# Patient Record
Sex: Female | Born: 1960 | Race: White | Hispanic: No | Marital: Single | Smoking: Never smoker
Health system: Southern US, Community
[De-identification: ages and names within clinical notes are randomized; demographics above are authoritative.]

## PROBLEM LIST (undated history)

## (undated) DIAGNOSIS — J45909 Unspecified asthma, uncomplicated: Secondary | ICD-10-CM

## (undated) DIAGNOSIS — I10 Essential (primary) hypertension: Secondary | ICD-10-CM

## (undated) HISTORY — DX: Essential (primary) hypertension: I10

## (undated) HISTORY — DX: Unspecified asthma, uncomplicated: J45.909

---

## 2012-09-29 LAB — HM COLONOSCOPY

## 2019-09-21 ENCOUNTER — Encounter: Payer: Self-pay | Admitting: Family Medicine

## 2019-09-21 ENCOUNTER — Other Ambulatory Visit: Payer: Self-pay

## 2019-09-21 ENCOUNTER — Ambulatory Visit (INDEPENDENT_AMBULATORY_CARE_PROVIDER_SITE_OTHER): Payer: 59

## 2019-09-21 ENCOUNTER — Ambulatory Visit: Payer: Self-pay | Admitting: Nurse Practitioner

## 2019-09-21 ENCOUNTER — Ambulatory Visit: Payer: 59 | Admitting: Family Medicine

## 2019-09-21 ENCOUNTER — Ambulatory Visit: Payer: Self-pay | Admitting: *Deleted

## 2019-09-21 VITALS — BP 140/80 | HR 85 | Temp 97.2°F | Ht 67.0 in | Wt 277.8 lb

## 2019-09-21 DIAGNOSIS — J4520 Mild intermittent asthma, uncomplicated: Secondary | ICD-10-CM | POA: Diagnosis not present

## 2019-09-21 DIAGNOSIS — R200 Anesthesia of skin: Secondary | ICD-10-CM | POA: Diagnosis not present

## 2019-09-21 DIAGNOSIS — Z7689 Persons encountering health services in other specified circumstances: Secondary | ICD-10-CM

## 2019-09-21 DIAGNOSIS — Z0000 Encounter for general adult medical examination without abnormal findings: Secondary | ICD-10-CM | POA: Diagnosis not present

## 2019-09-21 DIAGNOSIS — Z6841 Body Mass Index (BMI) 40.0 and over, adult: Secondary | ICD-10-CM

## 2019-09-21 MED ORDER — ALBUTEROL SULFATE HFA 108 (90 BASE) MCG/ACT IN AERS: 2.0000 | INHALATION_SPRAY | Freq: Four times a day (QID) | RESPIRATORY_TRACT | 2 refills | Status: DC | PRN

## 2019-09-21 MED ORDER — PREDNISONE 20 MG PO TABS: ORAL_TABLET | ORAL | 0 refills | Status: DC

## 2019-09-21 NOTE — Progress Notes (Signed)
Vicki Frazier is a 58 y.o. female  Chief Complaint  Patient presents with  . Establish Care    pap & mamm 80yrs ago, colonoscopy about 8 yrs ago. cpe X fasting    HPI: Vicki Frazier is a 58 y.o. female here to establish care with our office. She moved from [place] last fall.  She is due for CPE, labs, PAP, mammo (last 3 years ago). Last colonoscopy - 8 years ago  She takes zyrtec daily and uses flonase daily for allergies. She has done allergy shots x 2. She has well-controlled, mild intermittent asthma. She has an expired albuterol inhaler at home but would like refill of this. She uses it very infrequently.   She complains of Rt lower leg pain which she describes as "heaviness" x 4-5 days and then numbness in great toe x 3 days and now numbness in 2nd-3rd toes x 2 days, decreased sensation Rt heel since this AM. No swelling. No warmth. No tenderness to touch. No fever, chills. No falls. No back pain. Normal movement. No slurred speech. No headache. No dizziness.  She states she did PT for bulging disc in Rt lumbar spine 25 years ago and had similar but not exact same symptoms at that time. No injury or trauma.   Past Medical History:  Diagnosis Date  . Asthma   . Hypertension     History reviewed. No pertinent surgical history.  Social History   Socioeconomic History  . Marital status: Single    Spouse name: Not on file  . Number of children: Not on file  . Years of education: Not on file  . Highest education level: Not on file  Occupational History  . Not on file  Social Needs  . Financial resource strain: Not on file  . Food insecurity    Worry: Not on file    Inability: Not on file  . Transportation needs    Medical: Not on file    Non-medical: Not on file  Tobacco Use  . Smoking status: Never Smoker  . Smokeless tobacco: Never Used  Substance and Sexual Activity  . Alcohol use: Never    Frequency: Never  . Drug use: Never  . Sexual activity: Not on file   Lifestyle  . Physical activity    Days per week: Not on file    Minutes per session: Not on file  . Stress: Not on file  Relationships  . Social Herbalist on phone: Not on file    Gets together: Not on file    Attends religious service: Not on file    Active member of club or organization: Not on file    Attends meetings of clubs or organizations: Not on file    Relationship status: Not on file  . Intimate partner violence    Fear of current or ex partner: Not on file    Emotionally abused: Not on file    Physically abused: Not on file    Forced sexual activity: Not on file  Other Topics Concern  . Not on file  Social History Narrative  . Not on file    Family History  Problem Relation Age of Onset  . Cancer Father   . Obesity Sister   . Heart disease Sister      Immunization History  Administered Date(s) Administered  . Influenza,inj,Quad PF,6+ Mos 07/12/2019    No outpatient encounter medications on file as of 09/21/2019.   No facility-administered encounter  medications on file as of 09/21/2019.      ROS: Pertinent positives and negatives noted in HPI. Remainder of ROS non-contributory  No Known Allergies  BP 140/80   Pulse 85   Temp (!) 97.2 F (36.2 C) (Temporal)   Ht 5\' 7"  (1.702 m)   Wt 277 lb 12.8 oz (126 kg)   SpO2 96%   BMI 43.51 kg/m   Physical Exam  Constitutional: She is oriented to person, place, and time. She appears well-developed and well-nourished. No distress.  Obese   Musculoskeletal: Normal range of motion.        General: No tenderness or edema.  Neurological: She is alert and oriented to person, place, and time. She has normal strength and normal reflexes. A sensory deficit (decreased sensation plantar aspect of Rt great toe and 2-3rd toe, RLE posterior to medial malleoulus, and Rt heel) is present. No cranial nerve deficit. She exhibits normal muscle tone. Coordination normal.  Skin: Skin is warm and dry. No rash  noted. No erythema.  Psychiatric: She has a normal mood and affect. Her behavior is normal.     A/P:  1. Encounter to establish care with new doctor - due for CPE, PAP, mammo - will RTO for this  2. Mild intermittent asthma in adult without complication - stable, very well-controlled Refill: - albuterol (VENTOLIN HFA) 108 (90 Base) MCG/ACT inhaler; Inhale 2 puffs into the lungs every 6 (six) hours as needed for wheezing or shortness of breath.  Dispense: 8 g; Refill: 2  3. Annual physical exam - pt will schedule CPE appt - ALT; Future - AST; Future - Basic metabolic panel; Future - CBC; Future - Lipid panel; Future - TSH; Future - VITAMIN D 25 Hydroxy (Vit-D Deficiency, Fractures); Future  4. Class 3 severe obesity without serious comorbidity with body mass index (BMI) of 40.0 to 44.9 in adult, unspecified obesity type ([place]) - labs today and will discuss possible referral to [facility] at CPE appt - ALT; Future - AST; Future - Hemoglobin A1c; Future - Lipid panel; Future  5. 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, no back pain currently Rx: - predniSONE (DELTASONE) 20 MG tablet; 3 tabs po x 3 days, 2 tabs po x 3 days, 1 tab po x 3 days, 1/2 tab po x 3 days  Dispense: 20 tablet; Refill: 0 - DG Lumbar Spine Complete - Basic metabolic panel; Future - CBC; Future - C-reactive protein; Future - Hemoglobin A1c; Future - Iron, TIBC and Ferritin Panel; Future - Sedimentation rate; Future - TSH; Future - Vitamin B12; Future - will contact pt once lab results available to discuss results, see if pt has improvement with prednisone, and discuss further eval Discussed plan and reviewed medications with patient, including risks, benefits, and potential side effects. Pt expressed understand. All questions answered.  I  personally spent 35 min with the patient today and greater than 50% was spent in counseling, coordination of care, education

## 2019-09-21 NOTE — Telephone Encounter (Signed)
Appt with Dr. Loletha Grayer today new pt/address this concern  Ronny and Tonya--FYI

## 2019-09-21 NOTE — Telephone Encounter (Signed)
Pt called with complaint of waking up Saturday and feeling a sensation in her right leg. She stated that she now has numbness in her right big toe and has progressed to the one next one to it and now she it has gone to the bottom of her foot. She has twitching of the inner part of her knee now also.   She feels like it is progressing. She denies the pain, just the numbness. She is steady on her feet, and denies any cardiac or any other neurological symptoms. She has a new pt appt on Monday. Per protocol, she should be seen within hours. [facility] at [facility] notified regarding an appointment. Call conference in with the pratice and pt advised she would be on a brief hold. She voiced understanding.  Reason for Disposition . [1] Numbness (i.e., loss of sensation) of the face, arm / hand, or leg / foot on one side of the body AND [2] gradual onset (e.g., days to weeks) AND [3] present now  Answer Assessment - Initial Assessment Questions 1. SYMPTOM: "What is the main symptom you are concerned about?" (e.g., weakness, numbness)     numbness 2. ONSET: "When did this start?" (minutes, hours, days; while sleeping)     Saturday 3. LAST NORMAL: "When was the last time you were normal (no symptoms)?"      Before Saturday 4. PATTERN "Does this come and go, or has it been constant since it started?"  "Is it present now?"     constant 5. CARDIAC SYMPTOMS: "Have you had any of the following symptoms: chest pain, difficulty breathing, palpitations?"     no 6. NEUROLOGIC SYMPTOMS: "Have you had any of the following symptoms: headache, dizziness, vision loss, double vision, changes in speech, unsteady on your feet?"     no 7. OTHER SYMPTOMS: "Do you have any other symptoms?"     no 8. PREGNANCY: "Is there any chance you are pregnant?" "When was your last menstrual period?"     n/a  Protocols used: NEUROLOGIC DEFICIT-A-AH

## 2019-09-22 ENCOUNTER — Ambulatory Visit: Payer: Self-pay | Admitting: Nurse Practitioner

## 2019-09-22 ENCOUNTER — Other Ambulatory Visit (INDEPENDENT_AMBULATORY_CARE_PROVIDER_SITE_OTHER): Payer: 59

## 2019-09-22 DIAGNOSIS — Z6841 Body Mass Index (BMI) 40.0 and over, adult: Secondary | ICD-10-CM

## 2019-09-22 DIAGNOSIS — R200 Anesthesia of skin: Secondary | ICD-10-CM | POA: Diagnosis not present

## 2019-09-22 DIAGNOSIS — Z0000 Encounter for general adult medical examination without abnormal findings: Secondary | ICD-10-CM

## 2019-09-22 LAB — LIPID PANEL
Cholesterol: 253 mg/dL — ABNORMAL HIGH (ref 0–200)
HDL: 84.5 mg/dL (ref >39.00)
LDL Cholesterol: 154 mg/dL — ABNORMAL HIGH (ref 0–99)
NonHDL: 168.75
Total CHOL/HDL Ratio: 3
Triglycerides: 72 mg/dL (ref 0.0–149.0)
VLDL: 14.4 mg/dL (ref 0.0–40.0)

## 2019-09-22 LAB — CBC
HCT: 42.4 % (ref 36.0–46.0)
Hemoglobin: 13.8 g/dL (ref 12.0–15.0)
MCHC: 32.5 g/dL (ref 30.0–36.0)
MCV: 91.8 fl (ref 78.0–100.0)
Platelets: 350 10*3/uL (ref 150.0–400.0)
RBC: 4.62 Mil/uL (ref 3.87–5.11)
RDW: 13.8 % (ref 11.5–15.5)
WBC: 14.6 10*3/uL — ABNORMAL HIGH (ref 4.0–10.5)

## 2019-09-22 LAB — HEMOGLOBIN A1C: Hgb A1c MFr Bld: 5.7 % (ref 4.6–6.5)

## 2019-09-22 LAB — BASIC METABOLIC PANEL
BUN: 10 mg/dL (ref 6–23)
CO2: 25 mEq/L (ref 19–32)
Calcium: 10.5 mg/dL (ref 8.4–10.5)
Chloride: 103 mEq/L (ref 96–112)
Creatinine, Ser: 0.92 mg/dL (ref 0.40–1.20)
GFR: 62.71 mL/min (ref >60.00)
Glucose, Bld: 132 mg/dL — ABNORMAL HIGH (ref 70–99)
Potassium: 4.1 mEq/L (ref 3.5–5.1)
Sodium: 139 mEq/L (ref 135–145)

## 2019-09-22 LAB — VITAMIN D 25 HYDROXY (VIT D DEFICIENCY, FRACTURES): VITD: 29.61 ng/mL — ABNORMAL LOW (ref 30.00–100.00)

## 2019-09-22 LAB — ALT: ALT: 16 U/L (ref 0–35)

## 2019-09-22 LAB — C-REACTIVE PROTEIN: CRP: <1 mg/dL (ref 0.5–20.0)

## 2019-09-22 LAB — VITAMIN B12: Vitamin B-12: 650 pg/mL (ref 211–911)

## 2019-09-22 LAB — TSH: TSH: 0.54 u[IU]/mL (ref 0.35–4.50)

## 2019-09-22 LAB — SEDIMENTATION RATE: Sed Rate: 62 mm/hr — ABNORMAL HIGH (ref 0–30)

## 2019-09-22 LAB — AST: AST: 23 U/L (ref 0–37)

## 2019-09-22 NOTE — Addendum Note (Signed)
Addended by: Lynnea Ferrier on: 09/22/2019 12:51 PM   Modules accepted: Orders

## 2019-09-23 LAB — IRON,TIBC AND FERRITIN PANEL
Ferritin: 83 ng/mL (ref 15–150)
Iron Saturation: 15 % (ref 15–55)
Iron: 53 ug/dL (ref 27–159)
Total Iron Binding Capacity: 355 ug/dL (ref 250–450)
UIBC: 302 ug/dL (ref 131–425)

## 2019-09-25 ENCOUNTER — Ambulatory Visit: Payer: Self-pay | Admitting: Nurse Practitioner

## 2019-09-27 ENCOUNTER — Other Ambulatory Visit: Payer: Self-pay | Admitting: Family Medicine

## 2019-09-27 DIAGNOSIS — M5136 Other intervertebral disc degeneration, lumbar region: Secondary | ICD-10-CM

## 2019-10-26 ENCOUNTER — Encounter: Payer: 59 | Admitting: Family Medicine

## 2019-11-07 ENCOUNTER — Encounter: Payer: 59 | Admitting: Family Medicine

## 2019-11-29 ENCOUNTER — Other Ambulatory Visit: Payer: Self-pay

## 2019-11-30 ENCOUNTER — Encounter: Payer: Self-pay | Admitting: Family Medicine

## 2019-11-30 ENCOUNTER — Other Ambulatory Visit (HOSPITAL_COMMUNITY)
Admission: RE | Admit: 2019-11-30 | Discharge: 2019-11-30 | Disposition: A | Discharge status: Home / Self Care | Payer: 59 | Source: Ambulatory Visit | Attending: Family Medicine | Admitting: Family Medicine

## 2019-11-30 ENCOUNTER — Ambulatory Visit (INDEPENDENT_AMBULATORY_CARE_PROVIDER_SITE_OTHER): Payer: 59 | Admitting: Family Medicine

## 2019-11-30 VITALS — BP 118/85 | HR 85 | Temp 96.1°F | Ht 67.0 in | Wt 273.0 lb

## 2019-11-30 DIAGNOSIS — Z0000 Encounter for general adult medical examination without abnormal findings: Secondary | ICD-10-CM

## 2019-11-30 DIAGNOSIS — J4520 Mild intermittent asthma, uncomplicated: Secondary | ICD-10-CM

## 2019-11-30 DIAGNOSIS — Z124 Encounter for screening for malignant neoplasm of cervix: Secondary | ICD-10-CM | POA: Insufficient documentation

## 2019-11-30 DIAGNOSIS — E669 Obesity, unspecified: Secondary | ICD-10-CM | POA: Insufficient documentation

## 2019-11-30 DIAGNOSIS — J45909 Unspecified asthma, uncomplicated: Secondary | ICD-10-CM | POA: Insufficient documentation

## 2019-11-30 DIAGNOSIS — Z1231 Encounter for screening mammogram for malignant neoplasm of breast: Secondary | ICD-10-CM

## 2019-11-30 DIAGNOSIS — Z6841 Body Mass Index (BMI) 40.0 and over, adult: Secondary | ICD-10-CM

## 2019-11-30 DIAGNOSIS — Z1283 Encounter for screening for malignant neoplasm of skin: Secondary | ICD-10-CM

## 2019-11-30 NOTE — Progress Notes (Signed)
Vicki Frazier is a 59 y.o. female  Chief Complaint  Patient presents with  . Annual Exam    Pt has no complaints today.  Pt fasting for labs.  Pt is due for a pap smear.    HPI: Vicki Frazier is a 59 y.o. female here for annual CPE, PAP. She had done fasting labs in 09/2019.  She has no concerns or complaints today. She is due for mammogram.  Last PAP: due Last mammo: due  Last colonoscopy: due in 1-2 years  Diet/Exercise: walking 3 miles every other day, doing Whole 30 and has lost weight since 11/10/2019  Due for dental and vision  Med refills needed today? none   Past Medical History:  Diagnosis Date  . Asthma   . Hypertension     History reviewed. No pertinent surgical history.  Social History   Socioeconomic History  . Marital status: Single    Spouse name: Not on file  . Number of children: Not on file  . Years of education: Not on file  . Highest education level: Not on file  Occupational History  . Not on file  Tobacco Use  . Smoking status: Never Smoker  . Smokeless tobacco: Never Used  Substance and Sexual Activity  . Alcohol use: Never  . Drug use: Never  . Sexual activity: Not on file  Other Topics Concern  . Not on file  Social History Narrative  . Not on file   Social Determinants of Health   Financial Resource Strain:   . Difficulty of Paying Living Expenses: Not on file  Food Insecurity:   . Worried About Charity fundraiser in the Last Year: Not on file  . Ran Out of Food in the Last Year: Not on file  Transportation Needs:   . Lack of Transportation (Medical): Not on file  . Lack of Transportation (Non-Medical): Not on file  Physical Activity:   . Days of Exercise per Week: Not on file  . Minutes of Exercise per Session: Not on file  Stress:   . Feeling of Stress : Not on file  Social Connections:   . Frequency of Communication with Friends and Family: Not on file  . Frequency of Social Gatherings with Friends and Family: Not  on file  . Attends Religious Services: Not on file  . Active Member of Clubs or Organizations: Not on file  . Attends Archivist Meetings: Not on file  . Marital Status: Not on file  Intimate Partner Violence:   . Fear of Current or Ex-Partner: Not on file  . Emotionally Abused: Not on file  . Physically Abused: Not on file  . Sexually Abused: Not on file    Family History  Problem Relation Age of Onset  . Cancer Father   . Obesity Sister   . Heart disease Sister      Immunization History  Administered Date(s) Administered  . Influenza,inj,Quad PF,6+ Mos 07/12/2019    Outpatient Encounter Medications as of 11/30/2019  Medication Sig  . albuterol (VENTOLIN HFA) 108 (90 Base) MCG/ACT inhaler Inhale 2 puffs into the lungs every 6 (six) hours as needed for wheezing or shortness of breath.  . cetirizine (ZYRTEC) 10 MG chewable tablet Chew 10 mg by mouth daily.  . fluticasone (FLONASE) 50 MCG/ACT nasal spray Place into both nostrils daily.  . [DISCONTINUED] predniSONE (DELTASONE) 20 MG tablet 3 tabs po x 3 days, 2 tabs po x 3 days, 1 tab po x 3  days, 1/2 tab po x 3 days   No facility-administered encounter medications on file as of 11/30/2019.     ROS: Gen: no fever, chills  Skin: no rash, itching ENT: no ear pain, ear drainage, nasal congestion, rhinorrhea, sinus pressure, sore throat Eyes: no blurry vision, double vision Resp: no cough, wheeze,SOB Breast: no breast tenderness, no nipple discharge, no breast masses CV: no CP, palpitations, LE edema,  GI: no heartburn, n/v/d/c, abd pain GU: no dysuria, urgency, frequency, hematuria; no vaginal itching, odor, discharge MSK: no joint pain, myalgias, back pain Neuro: no dizziness, headache, weakness, vertigo Psych: no depression, anxiety, insomnia   No Known Allergies  BP 118/85 (BP Location: Left Arm, Patient Position: Sitting, Cuff Size: Large)   Pulse 85   Temp (!) 96.1 F (35.6 C) (Temporal)   Ht 5\' 7"   (1.702 m)   Wt 273 lb (123.8 kg)   SpO2 97%   BMI 42.76 kg/m   Physical Exam  Constitutional: She is oriented to person, place, and time. She appears well-developed and well-nourished. No distress.  Morbidly obese  HENT:  Head: Normocephalic and atraumatic.  Right Ear: Tympanic membrane and ear canal normal.  Left Ear: Tympanic membrane and ear canal normal.  Nose: Nose normal.  Mouth/Throat: Oropharynx is clear and moist and mucous membranes are normal.  Eyes: Pupils are equal, round, and reactive to light. Conjunctivae are normal.  Neck: No thyromegaly present.  Cardiovascular: Normal rate, regular rhythm, normal heart sounds and intact distal pulses.  No murmur heard. Pulmonary/Chest: Effort normal and breath sounds normal. No respiratory distress. She has no wheezes. She has no rhonchi. Right breast exhibits no mass, no nipple discharge, no skin change and no tenderness. Left breast exhibits no mass, no nipple discharge, no skin change and no tenderness.  Abdominal: Soft. Bowel sounds are normal. She exhibits no distension and no mass. There is no abdominal tenderness.  Genitourinary:    Vagina and uterus normal.  There is no rash, tenderness or lesion on the right labia. There is no rash, tenderness or lesion on the left labia. Cervix exhibits no motion tenderness, no discharge and no friability. Right adnexum displays no mass, no tenderness and no fullness. Left adnexum displays no mass, no tenderness and no fullness.  Musculoskeletal:        General: No edema.     Cervical back: Neck supple.  Lymphadenopathy:    She has no cervical adenopathy.  Neurological: She is alert and oriented to person, place, and time. She exhibits normal muscle tone. Coordination normal.  Skin: Skin is warm and dry.  Psychiatric: She has a normal mood and affect. Her behavior is normal.    A/P:  1. Annual physical exam - discussed importance of regular CV exercise, healthy diet, adequate sleep. She  is walking regularly and following Whole 30 diet - she is due for dental exam and vision - immunizations UTD - ALT - AST - Basic metabolic panel - VITAMIN D 25 Hydroxy (Vit-D Deficiency, Fractures) - Lipid panel - next CPE in 1 year  2. Mild intermittent asthma in adult without complication - stable, well-controlled - rarely using PRN albuterol  3. Class 3 severe obesity without serious comorbidity with body mass index (BMI) of 40.0 to 44.9 in adult, unspecified obesity type ([place]) - walking 3 miles every other day - diet over holidays not great, working to improve and doing Whole 30  4. Encounter for screening mammogram for malignant neoplasm of breast - MM DIGITAL  SCREENING BILATERAL; Future  5. Screening for cervical cancer - Cytology - PAP( [place])

## 2019-11-30 NOTE — Patient Instructions (Signed)
Health Maintenance, Female Adopting a healthy lifestyle and getting preventive care are important in promoting health and wellness. Ask your health care provider about:  The right schedule for you to have regular tests and exams.  Things you can do on your own to prevent diseases and keep yourself healthy. What should I know about diet, weight, and exercise? Eat a healthy diet   Eat a diet that includes plenty of vegetables, fruits, low-fat dairy products, and lean protein.  Do not eat a lot of foods that are high in solid fats, added sugars, or sodium. Maintain a healthy weight Body mass index (BMI) is used to identify weight problems. It estimates body fat based on height and weight. Your health care provider can help determine your BMI and help you achieve or maintain a healthy weight. Get regular exercise Get regular exercise. This is one of the most important things you can do for your health. Most adults should:  Exercise for at least 150 minutes each week. The exercise should increase your heart rate and make you sweat (moderate-intensity exercise).  Do strengthening exercises at least twice a week. This is in addition to the moderate-intensity exercise.  Spend less time sitting. Even light physical activity can be beneficial. Watch cholesterol and blood lipids Have your blood tested for lipids and cholesterol at 59 years of age, then have this test every 5 years. Have your cholesterol levels checked more often if:  Your lipid or cholesterol levels are high.  You are older than 59 years of age.  You are at high risk for heart disease. What should I know about cancer screening? Depending on your health history and family history, you may need to have cancer screening at various ages. This may include screening for:  Breast cancer.  Cervical cancer.  Colorectal cancer.  Skin cancer.  Lung cancer. What should I know about heart disease, diabetes, and high blood  pressure? Blood pressure and heart disease  High blood pressure causes heart disease and increases the risk of stroke. This is more likely to develop in people who have high blood pressure readings, are of African descent, or are overweight.  Have your blood pressure checked: ? Every 3-5 years if you are 18-39 years of age. ? Every year if you are 40 years old or older. Diabetes Have regular diabetes screenings. This checks your fasting blood sugar level. Have the screening done:  Once every three years after age 40 if you are at a normal weight and have a low risk for diabetes.  More often and at a younger age if you are overweight or have a high risk for diabetes. What should I know about preventing infection? Hepatitis B If you have a higher risk for hepatitis B, you should be screened for this virus. Talk with your health care provider to find out if you are at risk for hepatitis B infection. Hepatitis C Testing is recommended for:  Everyone born from 1945 through 1965.  Anyone with known risk factors for hepatitis C. Sexually transmitted infections (STIs)  Get screened for STIs, including gonorrhea and chlamydia, if: ? You are sexually active and are younger than 59 years of age. ? You are older than 59 years of age and your health care provider tells you that you are at risk for this type of infection. ? Your sexual activity has changed since you were last screened, and you are at increased risk for chlamydia or gonorrhea. Ask your health care provider if   you are at risk.  Ask your health care provider about whether you are at high risk for HIV. Your health care provider may recommend a prescription medicine to help prevent HIV infection. If you choose to take medicine to prevent HIV, you should first get tested for HIV. You should then be tested every 3 months for as long as you are taking the medicine. Pregnancy  If you are about to stop having your period (premenopausal) and  you may become pregnant, seek counseling before you get pregnant.  Take 400 to 800 micrograms (mcg) of folic acid every day if you become pregnant.  Ask for birth control (contraception) if you want to prevent pregnancy. Osteoporosis and menopause Osteoporosis is a disease in which the bones lose minerals and strength with aging. This can result in bone fractures. If you are 65 years old or older, or if you are at risk for osteoporosis and fractures, ask your health care provider if you should:  Be screened for bone loss.  Take a calcium or vitamin D supplement to lower your risk of fractures.  Be given hormone replacement therapy (HRT) to treat symptoms of menopause. Follow these instructions at home: Lifestyle  Do not use any products that contain nicotine or tobacco, such as cigarettes, e-cigarettes, and chewing tobacco. If you need help quitting, ask your health care provider.  Do not use street drugs.  Do not share needles.  Ask your health care provider for help if you need support or information about quitting drugs. Alcohol use  Do not drink alcohol if: ? Your health care provider tells you not to drink. ? You are pregnant, may be pregnant, or are planning to become pregnant.  If you drink alcohol: ? Limit how much you use to 0-1 drink a day. ? Limit intake if you are breastfeeding.  Be aware of how much alcohol is in your drink. In the [place] one drink equals one 12 oz bottle of beer (355 mL), one 5 oz glass of wine (148 mL), or one 1 oz glass of hard liquor (44 mL). General instructions  Schedule regular health, dental, and eye exams.  Stay current with your vaccines.  Tell your health care provider if: ? You often feel depressed. ? You have ever been abused or do not feel safe at home. Summary  Adopting a healthy lifestyle and getting preventive care are important in promoting health and wellness.  Follow your health care provider's instructions about healthy  diet, exercising, and getting tested or screened for diseases.  Follow your health care provider's instructions on monitoring your cholesterol and blood pressure. This information is not intended to replace advice given to you by your health care provider. Make sure you discuss any questions you have with your health care provider. Document Revised: 10/19/2018 Document Reviewed: 10/19/2018 Elsevier Patient Education  2020 [employer]  

## 2019-12-04 LAB — CYTOLOGY - PAP
Comment: NEGATIVE
Diagnosis: NEGATIVE
High risk HPV: NEGATIVE

## 2019-12-06 ENCOUNTER — Encounter: Payer: Self-pay | Admitting: Family Medicine

## 2020-01-23 ENCOUNTER — Other Ambulatory Visit: Payer: Self-pay

## 2020-01-23 ENCOUNTER — Ambulatory Visit
Admission: RE | Admit: 2020-01-23 | Discharge: 2020-01-23 | Disposition: A | Discharge status: Home / Self Care | Payer: 59 | Source: Ambulatory Visit | Attending: Family Medicine | Admitting: Family Medicine

## 2020-01-23 DIAGNOSIS — Z1231 Encounter for screening mammogram for malignant neoplasm of breast: Secondary | ICD-10-CM

## 2020-01-25 ENCOUNTER — Telehealth (INDEPENDENT_AMBULATORY_CARE_PROVIDER_SITE_OTHER): Payer: 59 | Admitting: Family Medicine

## 2020-01-25 ENCOUNTER — Encounter: Payer: Self-pay | Admitting: Family Medicine

## 2020-01-25 VITALS — BP 132/81 | HR 66 | Temp 98.7°F | Ht 67.0 in | Wt 247.0 lb

## 2020-01-25 DIAGNOSIS — L237 Allergic contact dermatitis due to plants, except food: Secondary | ICD-10-CM | POA: Diagnosis not present

## 2020-01-25 MED ORDER — PREDNISONE 20 MG PO TABS: 40.0000 mg | ORAL_TABLET | Freq: Every day | ORAL | 0 refills | Status: AC

## 2020-01-25 MED ORDER — TRIAMCINOLONE ACETONIDE 0.1 % EX CREA: 1.0000 "application " | TOPICAL_CREAM | Freq: Two times a day (BID) | CUTANEOUS | 1 refills | Status: DC

## 2020-01-25 NOTE — Telephone Encounter (Signed)
FYI

## 2020-01-25 NOTE — Patient Instructions (Signed)
Health Maintenance Due  Topic Date Due  . Hepatitis C Screening  Never done  . HIV Screening  Never done  . MAMMOGRAM  Never done  . COLONOSCOPY  Never done    Depression screen [facility] 2/9 09/21/2019  Decreased Interest 0  Down, Depressed, Hopeless 0  PHQ - 2 Score 0

## 2020-01-25 NOTE — Progress Notes (Signed)
Virtual Visit via Video Note  I connected with Vicki Frazier on 01/25/20 at  2:30 PM EDT by a video enabled telemedicine application and verified that I am speaking with the correct person using two identifiers. Location patient: home Location provider:  home office Persons participating in the virtual visit: patient, provider  I discussed the limitations of evaluation and management by telemedicine and the availability of in person appointments. The patient expressed understanding and agreed to proceed.  Chief Complaint  Patient presents with  . Poison Ivy    Pt c/o itching, weltps, x 4 days.  On wrist and forarms.    HPI: Vicki Frazier is a 59 y.o. female who complains of recent exposure to poison ivy while clearing brush from an area of her yard and subsequent 4 day h/o rash, itching. Rash on wrists in the area between where her gloves ended and shirt sleeve began.  She notes itchiness of her abdomen but no rash.  No face and neck rash. Her neck does feel itchy. She has used triamcinolone cream BID x 2 days.  She had 4 episodes of poison ivy last year.    Past Medical History:  Diagnosis Date  . Asthma   . Hypertension     History reviewed. No pertinent surgical history.  Family History  Problem Relation Age of Onset  . Cancer Father   . Obesity Sister   . Heart disease Sister     Social History   Tobacco Use  . Smoking status: Never Smoker  . Smokeless tobacco: Never Used  Substance Use Topics  . Alcohol use: Never  . Drug use: Never     Current Outpatient Medications:  .  albuterol (VENTOLIN HFA) 108 (90 Base) MCG/ACT inhaler, Inhale 2 puffs into the lungs every 6 (six) hours as needed for wheezing or shortness of breath., Disp: 8 g, Rfl: 2 .  cetirizine (ZYRTEC) 10 MG chewable tablet, Chew 10 mg by mouth daily., Disp: , Rfl:  .  fluticasone (FLONASE) 50 MCG/ACT nasal spray, Place into both nostrils daily., Disp: , Rfl:   No Known Allergies    ROS: See  pertinent positives and negatives per HPI.   EXAM:  VITALS per patient if applicable:BP 123456   Pulse 66   Temp 98.7 F (37.1 C)   Ht 5\' 7"  (1.702 m)   Wt 247 lb (112 kg)   BMI 38.69 kg/m     GENERAL: alert, oriented, appears well and in no acute distress  NECK: normal movements of the head and neck  LUNGS: on inspection no signs of respiratory distress, breathing rate appears normal, no obvious gross SOB, gasping or wheezing, no conversational dyspnea  CV: no obvious cyanosis  SKIN: erythematous vesiculopapular rash on B/L wrist  PSYCH/NEURO: pleasant and cooperative, speech and thought processing grossly intact   ASSESSMENT AND PLAN:  1. Contact dermatitis due to poison ivy Rx: - triamcinolone cream (KENALOG) 0.1 %; Apply 1 application topically 2 (two) times daily.  Dispense: 30 g; Refill: 1 - predniSONE (DELTASONE) 20 MG tablet; Take 2 tablets (40 mg total) by mouth daily with breakfast for 5 days.  Dispense: 10 tablet; Refill: 0 - f/u if symptoms worsen or do not improve in 7-10 days Discussed plan and reviewed medications with patient, including risks, benefits, and potential side effects. Pt expressed understand. All questions answered.    I discussed the assessment and treatment plan with the patient. The patient was provided an opportunity to ask questions and all were  answered. The patient agreed with the plan and demonstrated an understanding of the instructions.   The patient was advised to call back or seek an in-person evaluation if the symptoms worsen or if the condition fails to improve as anticipated.   Letta Median, DO

## 2020-04-25 ENCOUNTER — Ambulatory Visit (INDEPENDENT_AMBULATORY_CARE_PROVIDER_SITE_OTHER): Payer: 59 | Admitting: Family Medicine

## 2020-04-25 ENCOUNTER — Other Ambulatory Visit: Payer: Self-pay

## 2020-04-25 ENCOUNTER — Encounter: Payer: Self-pay | Admitting: Family Medicine

## 2020-04-25 ENCOUNTER — Ambulatory Visit (INDEPENDENT_AMBULATORY_CARE_PROVIDER_SITE_OTHER): Payer: 59

## 2020-04-25 VITALS — BP 110/80 | HR 67 | Temp 97.6°F | Ht 67.0 in | Wt 224.8 lb

## 2020-04-25 DIAGNOSIS — M25562 Pain in left knee: Secondary | ICD-10-CM

## 2020-04-25 DIAGNOSIS — M1712 Unilateral primary osteoarthritis, left knee: Secondary | ICD-10-CM | POA: Diagnosis not present

## 2020-04-25 MED ORDER — NAPROXEN 500 MG PO TABS: 500.0000 mg | ORAL_TABLET | Freq: Two times a day (BID) | ORAL | 0 refills | Status: DC

## 2020-04-25 NOTE — Progress Notes (Signed)
Vicki Frazier is a 59 y.o. female  Chief Complaint  Patient presents with  . Knee Pain    Pt c/o lt knee pain, pt fell 8 weeks ago and pain has worsened.  Pt is taking Advil and Ibuprofen, it only helps sometimes.    HPI: Vicki Frazier is a 59 y.o. female who complains of Lt knee pain, s/p fall 8 wks ago. She stepped off the curb and tripped. She had some bruising, which has resolved. Pt states her knee feels "unstable" and "has given out a few times". No swelling. Knee does not lock or catch.  Pain has increased in the past 1-2 wks.   Ibuprofen provides some relief but not consistent. She has used ace wrap to help with stability. She had been walking 3+ miles per day but has not been able to do that in the past 2 mo or so.    Past Medical History:  Diagnosis Date  . Asthma   . Hypertension     History reviewed. No pertinent surgical history.  Social History   Socioeconomic History  . Marital status: Single    Spouse name: Not on file  . Number of children: Not on file  . Years of education: Not on file  . Highest education level: Not on file  Occupational History  . Not on file  Tobacco Use  . Smoking status: Never Smoker  . Smokeless tobacco: Never Used  Vaping Use  . Vaping Use: Never used  Substance and Sexual Activity  . Alcohol use: Never  . Drug use: Never  . Sexual activity: Not on file  Other Topics Concern  . Not on file  Social History Narrative  . Not on file   Social Determinants of Health   Financial Resource Strain:   . Difficulty of Paying Living Expenses:   Food Insecurity:   . Worried About Charity fundraiser in the Last Year:   . Arboriculturist in the Last Year:   Transportation Needs:   . Film/video editor (Medical):   Marland Kitchen Lack of Transportation (Non-Medical):   Physical Activity:   . Days of Exercise per Week:   . Minutes of Exercise per Session:   Stress:   . Feeling of Stress :   Social Connections:   . Frequency of  Communication with Friends and Family:   . Frequency of Social Gatherings with Friends and Family:   . Attends Religious Services:   . Active Member of Clubs or Organizations:   . Attends Archivist Meetings:   Marland Kitchen Marital Status:   Intimate Partner Violence:   . Fear of Current or Ex-Partner:   . Emotionally Abused:   Marland Kitchen Physically Abused:   . Sexually Abused:     Family History  Problem Relation Age of Onset  . Cancer Father   . Obesity Sister   . Heart disease Sister      Immunization History  Administered Date(s) Administered  . Influenza,inj,Quad PF,6+ Mos 07/12/2019    Outpatient Encounter Medications as of 04/25/2020  Medication Sig  . albuterol (VENTOLIN HFA) 108 (90 Base) MCG/ACT inhaler Inhale 2 puffs into the lungs every 6 (six) hours as needed for wheezing or shortness of breath.  . cetirizine (ZYRTEC) 10 MG chewable tablet Chew 10 mg by mouth daily.  . fluticasone (FLONASE) 50 MCG/ACT nasal spray Place into both nostrils daily.  Marland Kitchen triamcinolone cream (KENALOG) 0.1 % Apply 1 application topically 2 (two) times daily. (Patient  not taking: Reported on 04/25/2020)   No facility-administered encounter medications on file as of 04/25/2020.     ROS: Pertinent positives and negatives noted in HPI. Remainder of ROS non-contributory   No Known Allergies  BP 110/80 (BP Location: Left Arm, Patient Position: Sitting, Cuff Size: Large)   Pulse 67   Temp 97.6 F (36.4 C) (Temporal)   Ht 5\' 7"  (1.702 m)   Wt 224 lb 12.8 oz (102 kg)   SpO2 98%   BMI 35.21 kg/m   Physical Exam Constitutional:      General: She is not in acute distress. Musculoskeletal:     Right knee: Normal.     Left knee: Crepitus present. No swelling, deformity, effusion, erythema or ecchymosis. Normal range of motion. Tenderness present over the medial joint line. No LCL laxity, MCL laxity, ACL laxity or PCL laxity.    Instability Tests: Anterior drawer test negative. Posterior drawer test  negative. Anterior Lachman test negative.  Neurological:     Mental Status: She is alert and oriented to person, place, and time.      A/P:  1. Acute pain of left knee 2. Primary osteoarthritis of left knee - DG Knee Complete 4 Views Left Rx: - naproxen (NAPROSYN) 500 MG tablet; Take 1 tablet (500 mg total) by mouth 2 (two) times daily with a meal.  Dispense: 60 tablet; Refill: 0 - BID x 10-14 days then stop - f/u if symptoms worsen or do not improve in 2 wks  This visit occurred during the SARS-CoV-2 public health emergency.  Safety protocols were in place, including screening questions prior to the visit, additional usage of staff PPE, and extensive cleaning of exam room while observing appropriate contact time as indicated for disinfecting solutions.

## 2020-05-17 ENCOUNTER — Telehealth: Payer: Self-pay | Admitting: Family Medicine

## 2020-05-17 NOTE — Telephone Encounter (Signed)
Patient is calling and stated that she was seen for knee pain and Dr. Loletha Grayer put her on Naproxen but isn't working. Patient wanted to see if there is something else she can take, please advise. CB is [phone]

## 2020-05-20 NOTE — Telephone Encounter (Signed)
Pt was called and VM was left telling pt Dr C wanted pt to return in two weeks if no improvement or worsening symptoms. Pt asked to return call so F/U appt can be scheduled.

## 2020-05-23 ENCOUNTER — Other Ambulatory Visit: Payer: Self-pay

## 2020-05-24 ENCOUNTER — Ambulatory Visit (INDEPENDENT_AMBULATORY_CARE_PROVIDER_SITE_OTHER): Payer: PRIVATE HEALTH INSURANCE | Admitting: Family Medicine

## 2020-05-24 ENCOUNTER — Encounter: Payer: Self-pay | Admitting: Family Medicine

## 2020-05-24 VITALS — BP 122/90 | HR 58 | Temp 97.3°F | Ht 67.0 in | Wt 224.0 lb

## 2020-05-24 DIAGNOSIS — G8929 Other chronic pain: Secondary | ICD-10-CM

## 2020-05-24 DIAGNOSIS — M25562 Pain in left knee: Secondary | ICD-10-CM | POA: Diagnosis not present

## 2020-05-24 NOTE — Progress Notes (Signed)
Vicki Frazier is a 59 y.o. female  Chief Complaint  Patient presents with  . Follow-up    Pt here for lt knee pain.  Pt would like to know if she should continue meds, she explains that it has progressed but not like before the pain.    HPI: Vicki Frazier is a 59 y.o. female here for f/u on her Lt knee pain. She was seen by me 1 mo ago and at that time she had been having pain since a fall 8 wks prior. She was given Rx for naproxen 500mg  BID x 10-14 days and xray was done. Xray showed mod to sever joint space narrowing of patellofemoral compartment, as well as mild arthritic changes, joint space narrowing, and spurring in medial and lateral compartments.  Today pt states she has had some improvement and will have a few good days but then too much standing, walking will "set it off" and cause a "string of bad days". She describes 2 specific areas of pain and then at times generalized pain. Stairs, going up or down, cause increased pain.    EXAM: LEFT KNEE - COMPLETE 4+ VIEW  COMPARISON:  None.  FINDINGS: There is no acute fracture or dislocation. The bones are mildly osteopenic. Mild arthritic changes of the medial and lateral compartment with mild joint space narrowing and spurring. There is moderate to severe joint space narrowing involving the patellofemoral compartment. No joint effusion. The soft tissues are unremarkable.  IMPRESSION: 1. No acute fracture or dislocation. 2. Degenerative changes most severe involving the patellofemoral space.  Past Medical History:  Diagnosis Date  . Asthma   . Hypertension     History reviewed. No pertinent surgical history.  Social History   Socioeconomic History  . Marital status: Single    Spouse name: Not on file  . Number of children: Not on file  . Years of education: Not on file  . Highest education level: Not on file  Occupational History  . Not on file  Tobacco Use  . Smoking status: Never Smoker  . Smokeless  tobacco: Never Used  Vaping Use  . Vaping Use: Never used  Substance and Sexual Activity  . Alcohol use: Never  . Drug use: Never  . Sexual activity: Not on file  Other Topics Concern  . Not on file  Social History Narrative  . Not on file   Social Determinants of Health   Financial Resource Strain:   . Difficulty of Paying Living Expenses:   Food Insecurity:   . Worried About Charity fundraiser in the Last Year:   . Arboriculturist in the Last Year:   Transportation Needs:   . Film/video editor (Medical):   Marland Kitchen Lack of Transportation (Non-Medical):   Physical Activity:   . Days of Exercise per Week:   . Minutes of Exercise per Session:   Stress:   . Feeling of Stress :   Social Connections:   . Frequency of Communication with Friends and Family:   . Frequency of Social Gatherings with Friends and Family:   . Attends Religious Services:   . Active Member of Clubs or Organizations:   . Attends Archivist Meetings:   Marland Kitchen Marital Status:   Intimate Partner Violence:   . Fear of Current or Ex-Partner:   . Emotionally Abused:   Marland Kitchen Physically Abused:   . Sexually Abused:     Family History  Problem Relation Age of Onset  .  Cancer Father   . Obesity Sister   . Heart disease Sister      Immunization History  Administered Date(s) Administered  . Influenza,inj,Quad PF,6+ Mos 07/12/2019  . PFIZER SARS-COV-2 Vaccination 01/29/2020, 02/19/2020    Outpatient Encounter Medications as of 05/24/2020  Medication Sig  . albuterol (VENTOLIN HFA) 108 (90 Base) MCG/ACT inhaler Inhale 2 puffs into the lungs every 6 (six) hours as needed for wheezing or shortness of breath.  . cetirizine (ZYRTEC) 10 MG chewable tablet Chew 10 mg by mouth daily.  . fluticasone (FLONASE) 50 MCG/ACT nasal spray Place into both nostrils daily.  Marland Kitchen ibuprofen (ADVIL) 200 MG tablet Take 200 mg by mouth every 6 (six) hours as needed.  . naproxen (NAPROSYN) 500 MG tablet Take 1 tablet (500 mg  total) by mouth 2 (two) times daily with a meal.  . triamcinolone cream (KENALOG) 0.1 % Apply 1 application topically 2 (two) times daily. (Patient not taking: Reported on 04/25/2020)   No facility-administered encounter medications on file as of 05/24/2020.     ROS: Pertinent positives and negatives noted in HPI. Remainder of ROS non-contributory   No Known Allergies  BP 122/90 (BP Location: Left Arm, Patient Position: Sitting, Cuff Size: Normal)   Pulse (!) 58   Temp (!) 97.3 F (36.3 C) (Temporal)   Ht 5\' 7"  (1.702 m)   Wt 224 lb (101.6 kg)   SpO2 100%   BMI 35.08 kg/m   Physical Exam Musculoskeletal:     Right knee: Normal.     Left knee: Crepitus present. No swelling, deformity or effusion. Normal range of motion. Tenderness present. No LCL laxity, MCL laxity, ACL laxity or PCL laxity. Neurological:     Mental Status: She is oriented to person, place, and time.  Psychiatric:        Behavior: Behavior normal.      A/P:  1. Chronic patellofemoral pain of left knee - symptoms x 12 wks, some improvement but still with pain despite 4 wks of NSAIDs, heat/ice, home exercises - Ambulatory referral to Orthopedic Surgery - MR Knee Left  Wo Contrast; Future Discussed plan and reviewed medications with patient, including risks, benefits, and potential side effects. Pt expressed understand. All questions answered.    This visit occurred during the SARS-CoV-2 public health emergency.  Safety protocols were in place, including screening questions prior to the visit, additional usage of staff PPE, and extensive cleaning of exam room while observing appropriate contact time as indicated for disinfecting solutions.

## 2020-05-24 NOTE — Patient Instructions (Signed)
Health Maintenance Due  Topic Date Due  . Hepatitis C Screening  Never done  . HIV Screening  Never done  . COLONOSCOPY  Never done    Depression screen [facility] 2/9 09/21/2019  Decreased Interest 0  Down, Depressed, Hopeless 0  PHQ - 2 Score 0

## 2020-06-07 ENCOUNTER — Other Ambulatory Visit: Payer: Self-pay

## 2020-06-07 ENCOUNTER — Ambulatory Visit
Admission: RE | Admit: 2020-06-07 | Discharge: 2020-06-07 | Disposition: A | Discharge status: Home / Self Care | Payer: 59 | Source: Ambulatory Visit | Attending: Family Medicine | Admitting: Family Medicine

## 2020-06-07 DIAGNOSIS — M25562 Pain in left knee: Secondary | ICD-10-CM

## 2020-06-12 ENCOUNTER — Ambulatory Visit (INDEPENDENT_AMBULATORY_CARE_PROVIDER_SITE_OTHER): Payer: PRIVATE HEALTH INSURANCE | Admitting: Orthopedic Surgery

## 2020-06-12 ENCOUNTER — Encounter: Payer: Self-pay | Admitting: Orthopedic Surgery

## 2020-06-12 VITALS — Ht 67.0 in | Wt 224.0 lb

## 2020-06-12 DIAGNOSIS — M1712 Unilateral primary osteoarthritis, left knee: Secondary | ICD-10-CM | POA: Diagnosis not present

## 2020-06-12 DIAGNOSIS — S83249A Other tear of medial meniscus, current injury, unspecified knee, initial encounter: Secondary | ICD-10-CM

## 2020-06-12 DIAGNOSIS — S83242A Other tear of medial meniscus, current injury, left knee, initial encounter: Secondary | ICD-10-CM | POA: Diagnosis not present

## 2020-06-12 MED ORDER — MELOXICAM 15 MG PO TABS: 15.0000 mg | ORAL_TABLET | Freq: Every day | ORAL | 0 refills | Status: DC

## 2020-06-15 ENCOUNTER — Encounter: Payer: Self-pay | Admitting: Orthopedic Surgery

## 2020-06-15 NOTE — Progress Notes (Addendum)
Office Visit Note   Patient: Vicki Frazier           Date of Birth: 1960-11-12           MRN: [id number] Visit Date: 06/12/2020 Requested by: Ronnald Nian, DO [place],  [place] [id number] PCP: Ronnald Nian, DO  Subjective: Chief Complaint  Patient presents with  . Left Knee - Pain    HPI: Vicki Frazier is a 59 y.o. female who presents to the office complaining of left knee pain.  Patient notes left knee pain began after fall in April.  She fell and landed onto the left knee.  She has a history of walking 4 miles daily.  She saw her primary care physician in June who put her on naproxen and ordered x-rays that revealed osteoarthritis of the left knee.  With continued pain, an MRI was ordered.  MRI revealed medial meniscal root tear with extrusion of the medial meniscus.  She complains primarily of medial sided pain with some patellofemoral pain.  She denies any locking symptoms.  Pain does not wake her up at night.  He does note it occasionally gives out with bursts of intense pain.  She denies any groin pain, low back pain, numbness/tingling, radicular pain.  She has no history of left knee surgery.  Denies any history of left knee injection.  She states that her symptoms are worse with navigating stairs, pivoting.  Symptoms are improving slowly.  She has lost 65 pounds since January 1 of this year..                ROS: All systems reviewed are negative as they relate to the chief complaint within the history of present illness.  Patient denies fevers or chills.  Assessment & Plan: Visit Diagnoses: No diagnosis found.  Plan: Patient is a 59 year old female presents complaint of left knee pain.  She had MRI of the left knee that demonstrated a medial meniscal root tear with extrusion of the meniscus following a fall in April where she fell and landed on her left knee.  She notes that her symptoms are improving slowly.  She does have significant arthritis that  was demonstrated on the MRI scan as well.  Due to the concurrent presence of osteoarthritis with meniscal root tear, do not think that meniscal root repair is a viable strategy to improve her pain.  Plan to administer left knee cortisone injection after aspiration of left knee effusion today.  Patient agrees with plan.  Follow-up in 3 months for clinical recheck.  Mobic was prescribed as well.  Take this instead of naproxen.  This patient is diagnosed with osteoarthritis of the knee(s).    Radiographs show evidence of joint space narrowing, osteophytes, subchondral sclerosis and/or subchondral cysts.  This patient has knee pain which interferes with functional and activities of daily living.    This patient has experienced inadequate response, adverse effects and/or intolerance with conservative treatments such as acetaminophen, NSAIDS, topical creams, physical therapy or regular exercise, knee bracing and/or weight loss.   This patient has experienced inadequate response or has a contraindication to intra articular steroid injections for at least 3 months.   This patient is not scheduled to have a total knee replacement within 6 months of starting treatment with viscosupplementation.     Follow-Up Instructions: No follow-ups on file.   Orders:  No orders of the defined types were placed in this encounter.  Meds ordered this encounter  Medications  . meloxicam (MOBIC) 15 MG tablet    Sig: Take 1 tablet (15 mg total) by mouth daily.    Dispense:  30 tablet    Refill:  0      Procedures: Large Joint Inj: R knee on 06/16/2020 9:51 PM Indications: diagnostic evaluation, joint swelling and pain Details: 18 G 1.5 in needle, superolateral approach  Arthrogram: No  Medications: 5 mL lidocaine 1 %; 40 mg methylPREDNISolone acetate 40 MG/ML; 4 mL bupivacaine 0.25 % Outcome: tolerated well, no immediate complications Procedure, treatment alternatives, risks and benefits explained, specific  risks discussed. Consent was given by the patient. Immediately prior to procedure a time out was called to verify the correct patient, procedure, equipment, support staff and site/side marked as required. Patient was prepped and draped in the usual sterile fashion.     Above detailed injection into the large joint was performed into the left knee not the right knee.  Clinical Data: No additional findings.  Objective: Vital Signs: Ht 5\' 7"  (1.702 m)   Wt 224 lb (101.6 kg)   BMI 35.08 kg/m   Physical Exam:  Constitutional: Patient appears well-developed HEENT:  Head: Normocephalic Eyes:EOM are normal Neck: Normal range of motion Cardiovascular: Normal rate Pulmonary/chest: Effort normal Neurologic: Patient is alert Skin: Skin is warm Psychiatric: Patient has normal mood and affect  Ortho Exam: Ortho exam demonstrates left knee with positive effusion.  Tenderness to palpation of the medial joint line.  Mild pain with side-to-side motion of the patella.  0 degrees extension, greater than 90 degrees of flexion.  No tenderness palpation of the lateral joint line.  No groin pain elicited with hip range of motion of the left hip.  Negative straight leg raise.  Extensor mechanism intact of the left knee.  Specialty Comments:  No specialty comments available.  Imaging: No results found.   PMFS History: Patient Active Problem List   Diagnosis Date Noted  . Asthma in adult 11/30/2019  . Class 3 severe obesity without serious comorbidity with body mass index (BMI) of 40.0 to 44.9 in adult [facility]) 11/30/2019   Past Medical History:  Diagnosis Date  . Asthma   . Hypertension     Family History  Problem Relation Age of Onset  . Cancer Father   . Obesity Sister   . Heart disease Sister     No past surgical history on file. Social History   Occupational History  . Not on file  Tobacco Use  . Smoking status: Never Smoker  . Smokeless tobacco: Never Used  Vaping Use  . Vaping  Use: Never used  Substance and Sexual Activity  . Alcohol use: Never  . Drug use: Never  . Sexual activity: Not on file

## 2020-06-16 ENCOUNTER — Encounter: Payer: Self-pay | Admitting: Orthopedic Surgery

## 2020-06-16 DIAGNOSIS — M1712 Unilateral primary osteoarthritis, left knee: Secondary | ICD-10-CM

## 2020-06-16 MED ORDER — METHYLPREDNISOLONE ACETATE 40 MG/ML IJ SUSP
40.0000 mg | INTRAMUSCULAR | Status: AC | PRN
  Administered 2020-06-16: 40 mg via INTRA_ARTICULAR

## 2020-06-16 MED ORDER — LIDOCAINE HCL 1 % IJ SOLN
5.0000 mL | INTRAMUSCULAR | Status: AC | PRN
  Administered 2020-06-16: 5 mL

## 2020-06-16 MED ORDER — BUPIVACAINE HCL 0.25 % IJ SOLN
4.0000 mL | INTRAMUSCULAR | Status: AC | PRN
  Administered 2020-06-16: 4 mL via INTRA_ARTICULAR

## 2020-06-17 ENCOUNTER — Telehealth: Payer: Self-pay

## 2020-06-17 ENCOUNTER — Encounter: Payer: Self-pay | Admitting: Orthopedic Surgery

## 2020-06-17 NOTE — Telephone Encounter (Signed)
-----   Message from Laurann Montana, RT sent at 06/17/2020  8:17 AM EDT -----  ----- Message ----- From: Meredith Pel, MD Sent: 06/16/2020   9:54 PM EDT To: Laurann Montana, RT  Hi Lauren can you preapproved for for gel injection.  Thanks

## 2020-06-17 NOTE — Telephone Encounter (Signed)
Addendum done

## 2020-06-17 NOTE — Telephone Encounter (Signed)
Submitted for VOB for synvisc one- left knee

## 2020-06-20 ENCOUNTER — Telehealth: Payer: Self-pay

## 2020-06-20 NOTE — Telephone Encounter (Signed)
Approved for Synvisc One- Left knee Dr. Latanya Presser and Bill No copay 30% OOP Prior auth required Auth # [id number] Dates: 06/18/20-06/18/21    Ok to schedule @ next available

## 2020-06-20 NOTE — Telephone Encounter (Signed)
Called pt and schedule appt

## 2020-07-09 ENCOUNTER — Other Ambulatory Visit: Payer: Self-pay | Admitting: Surgical

## 2020-07-09 DIAGNOSIS — M1712 Unilateral primary osteoarthritis, left knee: Secondary | ICD-10-CM

## 2020-07-09 NOTE — Telephone Encounter (Signed)
Please advise 

## 2020-07-11 ENCOUNTER — Ambulatory Visit (INDEPENDENT_AMBULATORY_CARE_PROVIDER_SITE_OTHER): Payer: PRIVATE HEALTH INSURANCE | Admitting: Orthopedic Surgery

## 2020-07-11 ENCOUNTER — Other Ambulatory Visit: Payer: Self-pay

## 2020-07-11 DIAGNOSIS — M1712 Unilateral primary osteoarthritis, left knee: Secondary | ICD-10-CM

## 2020-07-12 ENCOUNTER — Encounter: Payer: Self-pay | Admitting: Orthopedic Surgery

## 2020-07-12 DIAGNOSIS — M1712 Unilateral primary osteoarthritis, left knee: Secondary | ICD-10-CM

## 2020-07-12 NOTE — Telephone Encounter (Signed)
Needs repeat BMP or CMP to evaluate GFR with last GFR at 62 in Nov 2020.

## 2020-07-12 NOTE — Progress Notes (Signed)
   Procedure Note  Patient: Remona Boom             Date of Birth: [birth year]           MRN: [id number]             Visit Date: 07/11/2020  Procedures: Visit Diagnoses:  1. Unilateral primary osteoarthritis, left knee     Large Joint Inj: L knee on 07/12/2020 6:26 PM Indications: diagnostic evaluation, joint swelling and pain Details: 18 G 1.5 in needle, superolateral approach  Arthrogram: No  Medications: 5 mL lidocaine 1 %; 48 mg Hylan 48 MG/6ML Outcome: tolerated well, no immediate complications Procedure, treatment alternatives, risks and benefits explained, specific risks discussed. Consent was given by the patient. Immediately prior to procedure a time out was called to verify the correct patient, procedure, equipment, support staff and site/side marked as required. Patient was prepped and draped in the usual sterile fashion.

## 2020-07-15 ENCOUNTER — Encounter: Payer: Self-pay | Admitting: Orthopedic Surgery

## 2020-07-15 MED ORDER — LIDOCAINE HCL 1 % IJ SOLN
5.0000 mL | INTRAMUSCULAR | Status: AC | PRN
  Administered 2020-07-12: 5 mL

## 2020-07-15 MED ORDER — HYLAN G-F 20 48 MG/6ML IX SOSY
48.0000 mg | PREFILLED_SYRINGE | INTRA_ARTICULAR | Status: AC | PRN
  Administered 2020-07-12: 48 mg via INTRA_ARTICULAR

## 2020-07-26 ENCOUNTER — Telehealth: Payer: Self-pay | Admitting: Family Medicine

## 2020-07-26 NOTE — Telephone Encounter (Signed)
Patient is calling to see if she is due for her Hep B vaccines. They are giving the series at work. Please give her a call back at  [phone] and advise.

## 2020-07-29 NOTE — Telephone Encounter (Signed)
Called patient and she is starting a new job and had the Hep B series 20 years ago. She wanted to know if that was a lifetime immunity, advised that usually they will either draw blood work for a titer level or give a booster shot.  She will discuss this with her employer. No further questions. Dm/cma

## 2021-01-13 ENCOUNTER — Ambulatory Visit (INDEPENDENT_AMBULATORY_CARE_PROVIDER_SITE_OTHER): Payer: PRIVATE HEALTH INSURANCE | Admitting: Orthopedic Surgery

## 2021-01-13 ENCOUNTER — Encounter: Payer: Self-pay | Admitting: Orthopedic Surgery

## 2021-01-13 DIAGNOSIS — M1712 Unilateral primary osteoarthritis, left knee: Secondary | ICD-10-CM | POA: Diagnosis not present

## 2021-01-13 MED ORDER — HYLAN G-F 20 48 MG/6ML IX SOSY
48.0000 mg | PREFILLED_SYRINGE | INTRA_ARTICULAR | Status: AC | PRN
  Administered 2021-01-13: 48 mg via INTRA_ARTICULAR

## 2021-01-13 MED ORDER — LIDOCAINE HCL 1 % IJ SOLN
5.0000 mL | INTRAMUSCULAR | Status: AC | PRN
  Administered 2021-01-13: 5 mL

## 2021-01-13 NOTE — Progress Notes (Signed)
   Procedure Note  Patient: Vicki Frazier             Date of Birth: 12/23/1960           MRN: [id number]             Visit Date: 01/13/2021  Procedures: Visit Diagnoses:  1. Unilateral primary osteoarthritis, left knee     Large Joint Inj: L knee on 01/13/2021 2:45 PM Indications: pain, joint swelling and diagnostic evaluation Details: 18 G 1.5 in needle, superolateral approach  Arthrogram: No  Medications: 5 mL lidocaine 1 %; 48 mg Hylan 48 MG/6ML Outcome: tolerated well, no immediate complications Procedure, treatment alternatives, risks and benefits explained, specific risks discussed. Consent was given by the patient. Immediately prior to procedure a time out was called to verify the correct patient, procedure, equipment, support staff and site/side marked as required. Patient was prepped and draped in the usual sterile fashion.     This patient is diagnosed with osteoarthritis of the knee(s).    Radiographs show evidence of joint space narrowing, osteophytes, subchondral sclerosis and/or subchondral cysts.  This patient has knee pain which interferes with functional and activities of daily living.    This patient has experienced inadequate response, adverse effects and/or intolerance with conservative treatments such as acetaminophen, NSAIDS, topical creams, physical therapy or regular exercise, knee bracing and/or weight loss.   This patient has experienced inadequate response or has a contraindication to intra articular steroid injections for at least 3 months.   This patient is not scheduled to have a total knee replacement within 6 months of starting treatment with viscosupplementation.

## 2021-01-14 ENCOUNTER — Telehealth: Payer: Self-pay

## 2021-01-14 NOTE — Progress Notes (Signed)
Noted.  Will submit.  

## 2021-01-14 NOTE — Telephone Encounter (Signed)
Submitted VOB for Monovisc, left knee. Pending BV.

## 2021-07-21 ENCOUNTER — Ambulatory Visit (INDEPENDENT_AMBULATORY_CARE_PROVIDER_SITE_OTHER): Payer: PRIVATE HEALTH INSURANCE | Admitting: Family Medicine

## 2021-07-21 ENCOUNTER — Encounter: Payer: Self-pay | Admitting: Family Medicine

## 2021-07-21 ENCOUNTER — Other Ambulatory Visit: Payer: Self-pay

## 2021-07-21 VITALS — BP 128/78 | HR 71 | Temp 98.3°F | Ht 67.0 in | Wt 202.2 lb

## 2021-07-21 DIAGNOSIS — Z0000 Encounter for general adult medical examination without abnormal findings: Secondary | ICD-10-CM

## 2021-07-21 DIAGNOSIS — J3081 Allergic rhinitis due to animal (cat) (dog) hair and dander: Secondary | ICD-10-CM | POA: Diagnosis not present

## 2021-07-21 DIAGNOSIS — Z23 Encounter for immunization: Secondary | ICD-10-CM | POA: Diagnosis not present

## 2021-07-21 DIAGNOSIS — G44039 Episodic paroxysmal hemicrania, not intractable: Secondary | ICD-10-CM

## 2021-07-21 MED ORDER — PREDNISONE 10 MG (21) PO TBPK: ORAL_TABLET | ORAL | 0 refills | Status: DC

## 2021-07-21 NOTE — Progress Notes (Signed)
Established Patient Office Visit  Subjective:  Patient ID: Vicki Frazier, female    DOB: 1961/10/16  Age: 60 y.o. MRN: [id number]  CC:  Chief Complaint  Patient presents with   Headache    C/O daily headaches x few weeks.     HPI Vicki Frazier presents for evaluation of 6-week history headache.  Headache tends to be around the right eye.  They come and go.  She can have up to 3 daily.  Denies that they have increased in intensity.  There is no nausea or vomiting.  Denies difficulty swallowing or unilateral weakness.  Denies alteration vision but can feel off balance with her headaches.  History of elevated blood pressure and elevated glucose in the past that have been treated successfully with weight loss.  She has been following her blood pressure with multiple checks.  Blood pressure has been running from 10 7-129 over the 70s.  History of allergic rhinitis with associated RAD.  This is been treated successfully with daily Flonase and Zyrtec twice daily.  She has been taking these medications for years.  She is allergic to dog dander and does have a dog.  Denies increased stress in her life.  Denies increased stress in her life.  She lives alone with her dog.  Averages about 2 hours of screen time daily.  History of elevated ESR few years ago.  Dilated eye exam little under a year ago was normal she tells me.  Daily ibuprofen 200 mg chronic knee pain.  Past Medical History:  Diagnosis Date   Asthma    Hypertension     History reviewed. No pertinent surgical history.  Family History  Problem Relation Age of Onset   Cancer Father    Obesity Sister    Heart disease Sister     Social History   Socioeconomic History   Marital status: Single    Spouse name: Not on file   Number of children: Not on file   Years of education: Not on file   Highest education level: Not on file  Occupational History   Not on file  Tobacco Use   Smoking status: Never   Smokeless tobacco: Never   Vaping Use   Vaping Use: Never used  Substance and Sexual Activity   Alcohol use: Never   Drug use: Never   Sexual activity: Not on file  Other Topics Concern   Not on file  Social History Narrative   Not on file   Social Determinants of Health   Financial Resource Strain: Not on file  Food Insecurity: Not on file  Transportation Needs: Not on file  Physical Activity: Not on file  Stress: Not on file  Social Connections: Not on file  Intimate Partner Violence: Not on file    Outpatient Medications Prior to Visit  Medication Sig Dispense Refill   albuterol (VENTOLIN HFA) 108 (90 Base) MCG/ACT inhaler Inhale 2 puffs into the lungs every 6 (six) hours as needed for wheezing or shortness of breath. 8 g 2   cetirizine (ZYRTEC) 10 MG chewable tablet Chew 10 mg by mouth daily.     fluticasone (FLONASE) 50 MCG/ACT nasal spray Place into both nostrils daily.     ibuprofen (ADVIL) 200 MG tablet Take 200 mg by mouth every 6 (six) hours as needed.     Calcium-Magnesium-Vitamin D (CALCIUM 1200+D3 PO)      Multiple Vitamin (MULTI-VITAMIN) tablet      meloxicam (MOBIC) 15 MG tablet Take 1 tablet (  15 mg total) by mouth daily. 30 tablet 0   naproxen (NAPROSYN) 500 MG tablet Take 1 tablet (500 mg total) by mouth 2 (two) times daily with a meal. 60 tablet 0   triamcinolone cream (KENALOG) 0.1 % Apply 1 application topically 2 (two) times daily. (Patient not taking: Reported on 04/25/2020) 30 g 1   No facility-administered medications prior to visit.    No Known Allergies  ROS Review of Systems  Constitutional:  Negative for diaphoresis, fatigue, fever and unexpected weight change.  HENT: Negative.    Eyes:  Negative for photophobia and visual disturbance.  Respiratory: Negative.    Cardiovascular: Negative.   Gastrointestinal: Negative.   Endocrine: Negative for polyphagia and polyuria.  Genitourinary: Negative.   Musculoskeletal:  Negative for gait problem, joint swelling and  myalgias.  Neurological:  Positive for headaches. Negative for dizziness, tremors, facial asymmetry, speech difficulty, weakness and light-headedness.  Psychiatric/Behavioral: Negative.       Depression screen [facility] 2/9 07/21/2021 09/21/2019  Decreased Interest 0 0  Down, Depressed, Hopeless 0 0  PHQ - 2 Score 0 0     Objective:    Physical Exam Vitals and nursing note reviewed.  Constitutional:      General: She is not in acute distress.    Appearance: She is well-developed. She is not ill-appearing, toxic-appearing or diaphoretic.  HENT:     Head: Normocephalic and atraumatic.     Mouth/Throat:     Mouth: Mucous membranes are moist.     Pharynx: Oropharynx is clear.  Eyes:     General: No visual field deficit.    Extraocular Movements: Extraocular movements intact.     Pupils: Pupils are equal, round, and reactive to light.  Cardiovascular:     Rate and Rhythm: Normal rate and regular rhythm.  Pulmonary:     Effort: Pulmonary effort is normal.     Breath sounds: Normal breath sounds.  Abdominal:     General: Bowel sounds are normal.  Musculoskeletal:     Cervical back: Normal range of motion and neck supple.  Skin:    General: Skin is warm and dry.  Neurological:     Mental Status: She is alert and oriented to person, place, and time.     Cranial Nerves: No cranial nerve deficit, dysarthria or facial asymmetry.  Psychiatric:        Mood and Affect: Mood normal.        Behavior: Behavior normal.    BP 128/78 (BP Location: Left Arm, Patient Position: Sitting, Cuff Size: Large)   Pulse 71   Temp 98.3 F (36.8 C) (Temporal)   Ht '5\' 7"'  (1.702 m)   Wt 202 lb 3.2 oz (91.7 kg)   SpO2 97%   BMI 31.67 kg/m  Wt Readings from Last 3 Encounters:  07/21/21 202 lb 3.2 oz (91.7 kg)  06/12/20 224 lb (101.6 kg)  05/24/20 224 lb (101.6 kg)     Health Maintenance Due  Topic Date Due   HIV Screening  Never done   Hepatitis C Screening  Never done   COLONOSCOPY (Pts  45-9yr Insurance coverage will need to be confirmed)  Never done   Zoster Vaccines- Shingrix (1 of 2) Never done    There are no preventive care reminders to display for this patient.  Lab Results  Component Value Date   TSH 0.54 09/22/2019   Lab Results  Component Value Date   WBC 14.6 (H) 09/22/2019   HGB 13.8 09/22/2019  HCT 42.4 09/22/2019   MCV 91.8 09/22/2019   PLT 350.0 09/22/2019   Lab Results  Component Value Date   NA 139 09/22/2019   K 4.1 09/22/2019   CO2 25 09/22/2019   GLUCOSE 132 (H) 09/22/2019   BUN 10 09/22/2019   CREATININE 0.92 09/22/2019   AST 23 09/22/2019   ALT 16 09/22/2019   CALCIUM 10.5 09/22/2019   GFR 62.71 09/22/2019   Lab Results  Component Value Date   CHOL 253 (H) 09/22/2019   Lab Results  Component Value Date   HDL 84.50 09/22/2019   Lab Results  Component Value Date   LDLCALC 154 (H) 09/22/2019   Lab Results  Component Value Date   TRIG 72.0 09/22/2019   Lab Results  Component Value Date   CHOLHDL 3 09/22/2019   Lab Results  Component Value Date   HGBA1C 5.7 09/22/2019      Assessment & Plan:   Problem List Items Addressed This Visit   None Visit Diagnoses     Allergic rhinitis due to animal hair and dander    -  Primary   Episodic paroxysmal hemicrania, not intractable       Relevant Medications   predniSONE (STERAPRED UNI-PAK 21 TAB) 10 MG (21) TBPK tablet   Other Relevant Orders   CBC   Comprehensive metabolic panel   C-reactive protein   Sedimentation rate   MR Brain W Wo Contrast   Healthcare maintenance       Relevant Orders   Hemoglobin A1c   HIV Antibody (routine testing w rflx)   Ambulatory referral to Gastroenterology   Flu Vaccine QUAD 6+ mos PF IM (Fluarix Quad PF) (Completed)       Meds ordered this encounter  Medications   predniSONE (STERAPRED UNI-PAK 21 TAB) 10 MG (21) TBPK tablet    Sig: Take 6 today, 5 tomorrow, 4 the next day and then 3, 2, 1 and stop    Dispense:  21 tablet     Refill:  0     Follow-up: Return in about 4 weeks (around 08/18/2021).  New onset headache with unclear etiology.  History of elevated inflammatory marker.  Trial of a 6-day Dosepak of prednisone.  31 minutesspent with this patient between exam, history taking and review of the chart.  Libby Maw, MD

## 2021-07-22 LAB — SEDIMENTATION RATE: Sed Rate: 17 mm/hr (ref 0–30)

## 2021-07-22 LAB — CBC
HCT: 41.1 % (ref 36.0–46.0)
Hemoglobin: 13.2 g/dL (ref 12.0–15.0)
MCHC: 32.1 g/dL (ref 30.0–36.0)
MCV: 91.7 fl (ref 78.0–100.0)
Platelets: 278 10*3/uL (ref 150.0–400.0)
RBC: 4.48 Mil/uL (ref 3.87–5.11)
RDW: 13.5 % (ref 11.5–15.5)
WBC: 7.6 10*3/uL (ref 4.0–10.5)

## 2021-07-22 LAB — COMPREHENSIVE METABOLIC PANEL
ALT: 19 U/L (ref 0–35)
AST: 25 U/L (ref 0–37)
Albumin: 4.5 g/dL (ref 3.5–5.2)
Alkaline Phosphatase: 83 U/L (ref 39–117)
BUN: 21 mg/dL (ref 6–23)
CO2: 30 mEq/L (ref 19–32)
Calcium: 9.9 mg/dL (ref 8.4–10.5)
Chloride: 104 mEq/L (ref 96–112)
Creatinine, Ser: 0.99 mg/dL (ref 0.40–1.20)
GFR: 62.29 mL/min (ref >60.00)
Glucose, Bld: 95 mg/dL (ref 70–99)
Potassium: 4.8 mEq/L (ref 3.5–5.1)
Sodium: 141 mEq/L (ref 135–145)
Total Bilirubin: 0.5 mg/dL (ref 0.2–1.2)
Total Protein: 7.2 g/dL (ref 6.0–8.3)

## 2021-07-22 LAB — HEMOGLOBIN A1C: Hgb A1c MFr Bld: 5.8 % (ref 4.6–6.5)

## 2021-07-22 LAB — HIV ANTIBODY (ROUTINE TESTING W REFLEX): HIV 1&2 Ab, 4th Generation: NONREACTIVE

## 2021-07-22 LAB — C-REACTIVE PROTEIN: CRP: <1 mg/dL (ref 0.5–20.0)

## 2021-07-26 ENCOUNTER — Ambulatory Visit (HOSPITAL_BASED_OUTPATIENT_CLINIC_OR_DEPARTMENT_OTHER)
Admission: RE | Admit: 2021-07-26 | Discharge: 2021-07-26 | Disposition: A | Discharge status: Home / Self Care | Payer: 59 | Source: Ambulatory Visit | Attending: Family Medicine | Admitting: Family Medicine

## 2021-07-26 ENCOUNTER — Other Ambulatory Visit: Payer: Self-pay

## 2021-07-26 DIAGNOSIS — G44039 Episodic paroxysmal hemicrania, not intractable: Secondary | ICD-10-CM

## 2021-07-26 MED ORDER — GADOBUTROL 1 MMOL/ML IV SOLN
9.1000 mL | Freq: Once | INTRAVENOUS | Status: AC | PRN
  Administered 2021-07-26: 9.1 mL via INTRAVENOUS

## 2021-08-18 ENCOUNTER — Ambulatory Visit: Payer: PRIVATE HEALTH INSURANCE | Admitting: Family Medicine

## 2021-09-25 ENCOUNTER — Encounter: Payer: PRIVATE HEALTH INSURANCE | Admitting: Family Medicine

## 2021-10-06 ENCOUNTER — Telehealth: Payer: Self-pay | Admitting: Orthopedic Surgery

## 2021-10-06 NOTE — Telephone Encounter (Signed)
Noted  

## 2021-10-06 NOTE — Telephone Encounter (Signed)
Pt called stating she believes her gel injection is giving out and she would like to get another inj in her L knee; her last one was 01/13/21. Pt would like to have an autho sent and would like a CB with an update when insurance responds.   [phone]

## 2021-10-21 NOTE — Telephone Encounter (Signed)
Pt called again asking about her gel injection. Can you please call pt?  Cb [phone]

## 2021-10-22 NOTE — Telephone Encounter (Signed)
Talked with patient concerning gel injection.  

## 2021-10-29 ENCOUNTER — Telehealth: Payer: Self-pay | Admitting: Orthopedic Surgery

## 2021-10-29 ENCOUNTER — Ambulatory Visit: Payer: 59 | Admitting: Orthopedic Surgery

## 2021-10-29 NOTE — Telephone Encounter (Signed)
Talked with patient concerning insurance not covering gel injection.  Per patient's request, submitted for TriVisc, left knee.  Patient is aware that she will get a call from [employer] to discuss payment for injections.

## 2021-10-29 NOTE — Telephone Encounter (Signed)
Pt states April called her at the end of our business day yesterday. Pt is asking for a call back. Only seen note April already spoke with pt. Please call pt at [phone].

## 2021-10-31 DIAGNOSIS — M216X1 Other acquired deformities of right foot: Secondary | ICD-10-CM | POA: Insufficient documentation

## 2021-11-07 ENCOUNTER — Telehealth: Payer: Self-pay

## 2021-11-07 NOTE — Telephone Encounter (Signed)
Called and left a Vm for patient to CB to schedule for gel injections with Dr. Marlou Sa.  Received TriVisc injections for left knee. Patient purchased through [employer].

## 2021-11-17 IMAGING — MR MR HEAD WO/W CM
10 series · 48 of 48 positions shown · IV contrast (gadavist)
Comparison: None.

CLINICAL DATA: Headache episodic paroxysmal hemicrania, not
intractable. Headache, chronic, new features or increased frequency.

EXAM:
MRI HEAD WITHOUT AND WITH CONTRAST
TECHNIQUE: Multiplanar, multiecho pulse sequences of the brain and surrounding
structures were obtained without and with intravenous contrast.
CONTRAST:  9.1mL GADAVIST GADOBUTROL 1 MMOL/ML IV SOLN

[Series 2: T1 · sagittal · 5.0mm · 0.86mm/px · 3 of 27 slices shown (1 of 2)]
[im 1/27]
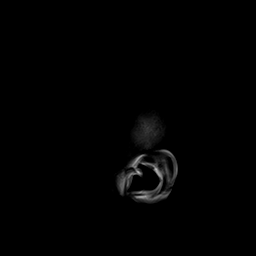
[im 14/27]
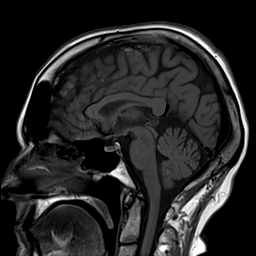
[im 27/27]
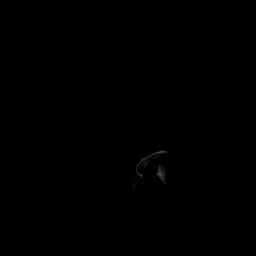

[Series 3: DWI · axial · 3.0mm · 1.88mm/px · z∈[-60,+95]mm · 9 of 96 slices shown (1 of 2)]
[im 1/96]
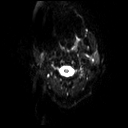
[im 12/96]
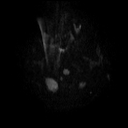
[im 24/96]
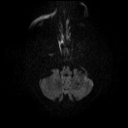
[im 36/96]
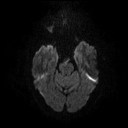
[im 48/96]
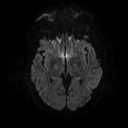
[im 60/96]
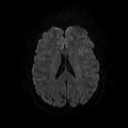
[im 72/96]
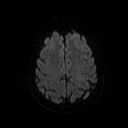
[im 84/96]
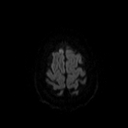
[im 96/96]
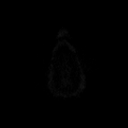

[Series 4: DWI · axial · 3.0mm · 1.88mm/px · z∈[-60,+95]mm · 4 of 47 slices shown (2 of 2)]
[im 1/47]
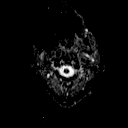
[im 16/47]
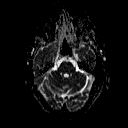
[im 31/47]
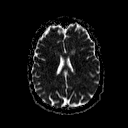
[im 47/47]
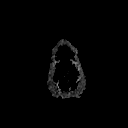

[Series 5: T2 · axial · 5.0mm · 0.69mm/px · z∈[-63,+98]mm · 3 of 28 slices shown (1 of 3)]
[im 1/28]
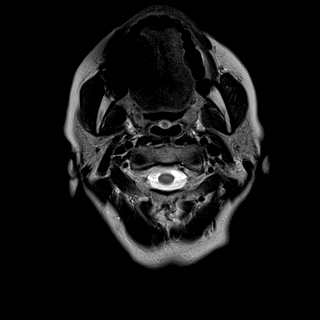
[im 14/28]
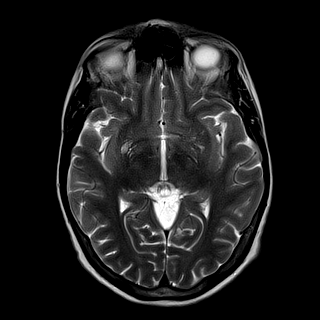
[im 28/28]
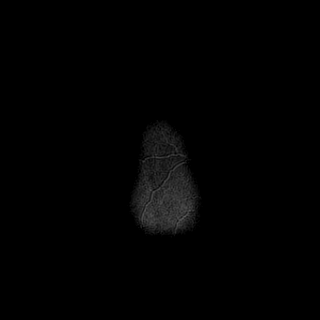

[Series 6: T2 · axial · 5.0mm · 0.43mm/px · z∈[-63,+98]mm · 3 of 28 slices shown (2 of 3)]
[im 1/28]
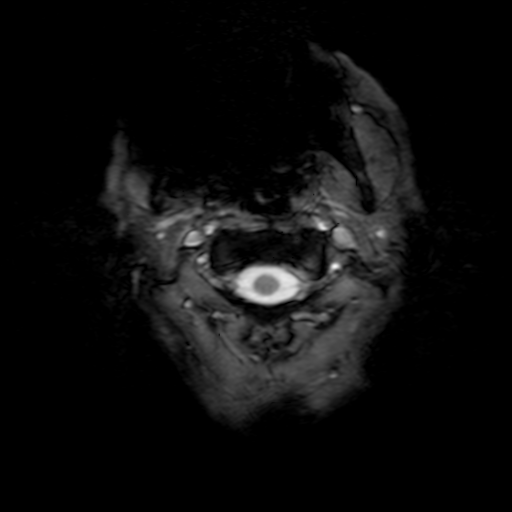
[im 14/28]
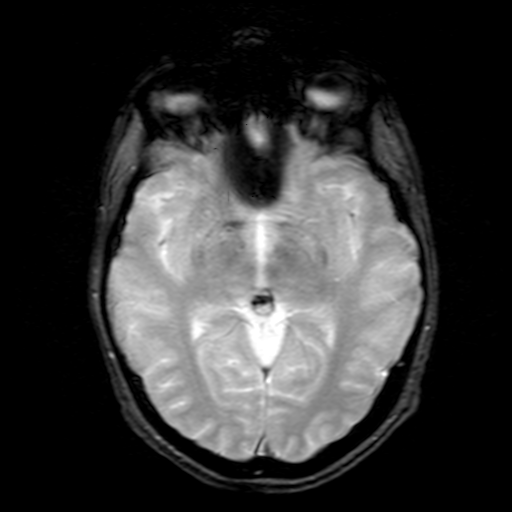
[im 28/28]
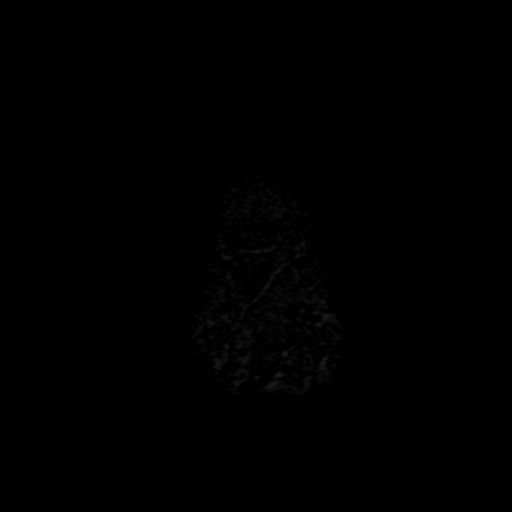

[Series 7: FLAIR · axial · 3.0mm · 0.43mm/px · z∈[-64,+99]mm · 4 of 42 slices shown]
[im 1/42]
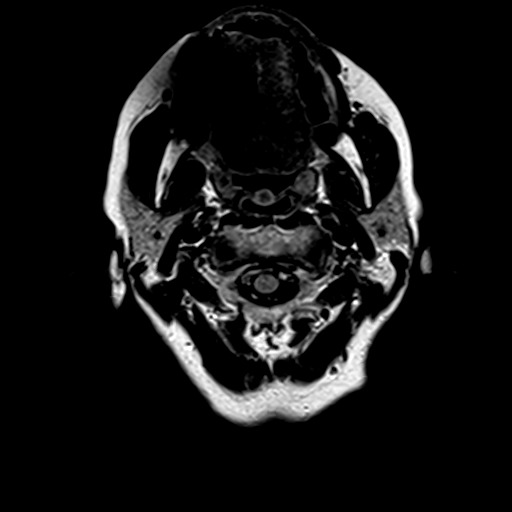
[im 14/42]
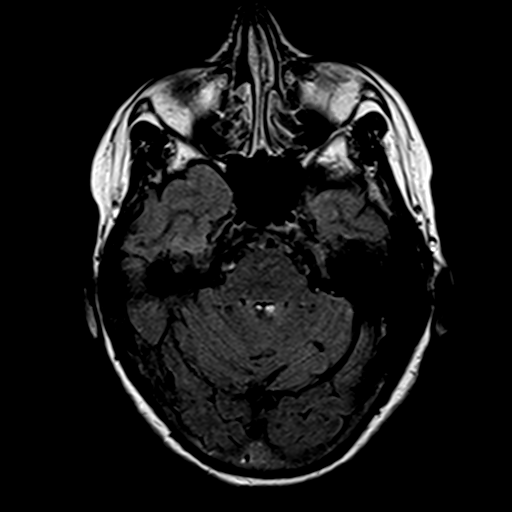
[im 28/42]
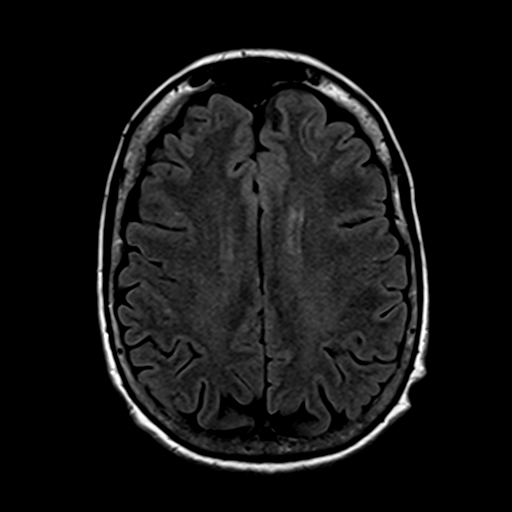
[im 42/42]
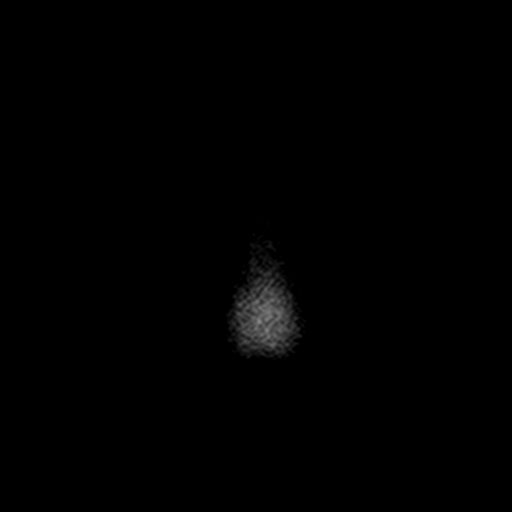

[Series 8: t1_3d_tra · axial · 2.0mm · 0.94mm/px · z∈[-59,+115]mm · 8 of 88 slices shown]
[im 1/88]
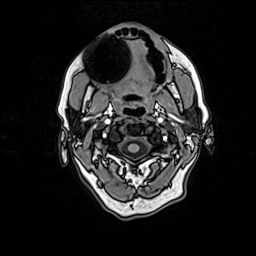
[im 13/88]
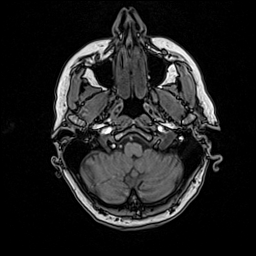
[im 25/88]
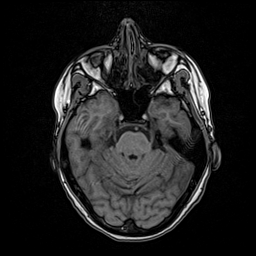
[im 38/88]
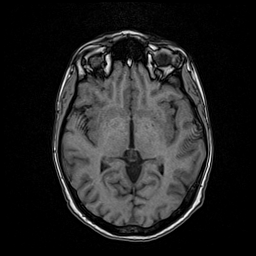
[im 50/88]
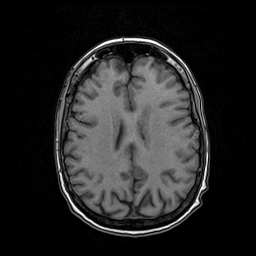
[im 63/88]
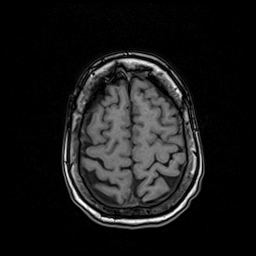
[im 75/88]
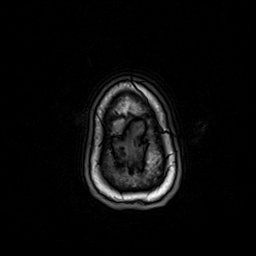
[im 88/88]
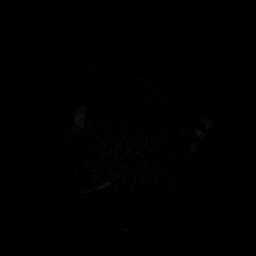

[Series 9: T2 · coronal · 5.0mm · 0.69mm/px · 3 of 32 slices shown (3 of 3)]
[im 1/32]
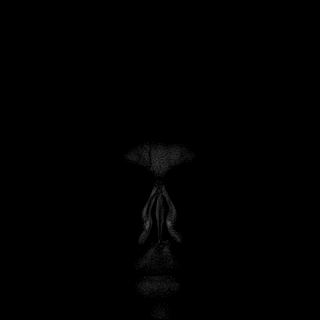
[im 16/32]
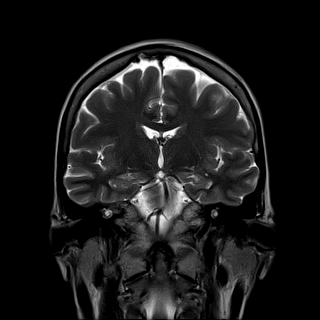
[im 32/32]
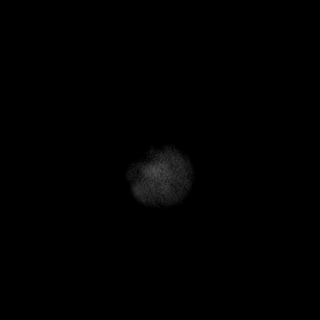

[Series 10: t1_3d_tra +c · axial · 2.0mm · 0.94mm/px · z∈[-59,+115]mm · 8 of 88 slices shown]
[im 1/88]
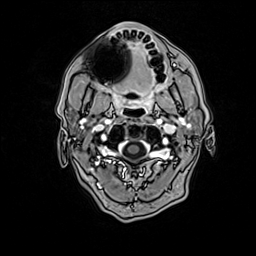
[im 13/88]
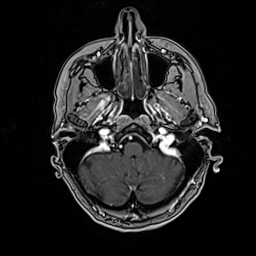
[im 25/88]
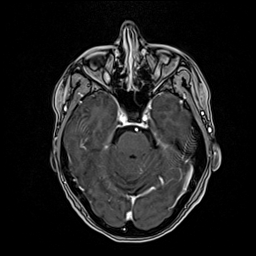
[im 38/88]
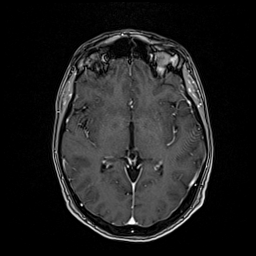
[im 50/88]
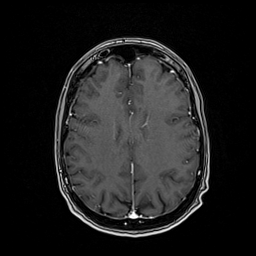
[im 63/88]
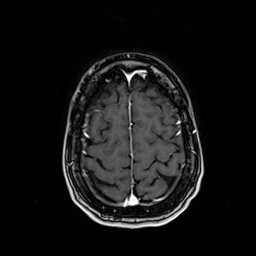
[im 75/88]
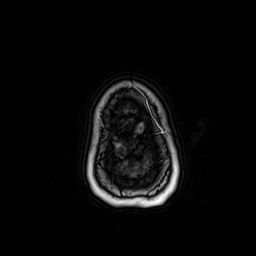
[im 88/88]
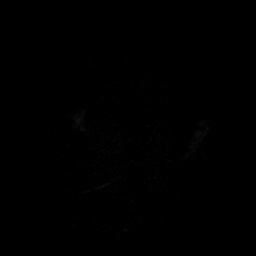

[Series 11: T1 · coronal · 5.0mm · 0.86mm/px · 3 of 32 slices shown (2 of 2)]
[im 1/32]
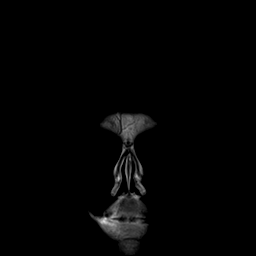
[im 16/32]
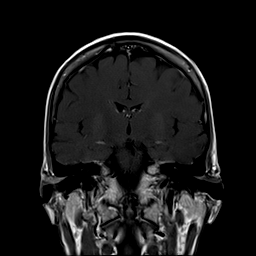
[im 32/32]
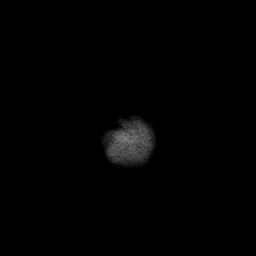

[48 of 48 positions shown; findings below may reference images not displayed]

FINDINGS: Brain: Focal T2 hyperintensity in the left external capsule likely
represents a remote lacunar infarct. Periventricular T2
hyperintensity, most evident adjacent to the anterior horn of left
lateral ventricle mildly advanced for age.

No acute infarct, hemorrhage, or mass lesion is present. The
ventricles are of normal size. No other significant white matter
disease is present. No significant extraaxial fluid collection is
present.

The internal auditory canals are within normal limits. The brainstem
and cerebellum are within normal limits.

Developmental venous anomaly noted in the left cerebellar
hemisphere. No other pathologic enhancement is present.

Vascular: Left cerebellar DVA. Flow is present in the major
intracranial arteries.

Skull and upper cervical spine: The craniocervical junction is
normal. Upper cervical spine is within normal limits. Marrow signal
is unremarkable.

Sinuses/Orbits: The paranasal sinuses and mastoid air cells are
clear. The globes and orbits are within normal limits.
IMPRESSION: 1. Remote lacunar infarct of the left external capsule.
2. Periventricular T2 hyperintensity is mildly advanced for age.
Given some asymmetry in proximity to the external capsule lesion,
this is most likely ischemic.
3. No acute intracranial abnormality or structural lesion to explain
the patient's headaches.

## 2021-11-26 ENCOUNTER — Ambulatory Visit (INDEPENDENT_AMBULATORY_CARE_PROVIDER_SITE_OTHER): Payer: PRIVATE HEALTH INSURANCE | Admitting: Orthopedic Surgery

## 2021-11-26 ENCOUNTER — Other Ambulatory Visit: Payer: Self-pay

## 2021-11-26 ENCOUNTER — Encounter: Payer: Self-pay | Admitting: Orthopedic Surgery

## 2021-11-26 DIAGNOSIS — M1712 Unilateral primary osteoarthritis, left knee: Secondary | ICD-10-CM | POA: Diagnosis not present

## 2021-11-26 MED ORDER — LIDOCAINE HCL 1 % IJ SOLN
5.0000 mL | INTRAMUSCULAR | Status: AC | PRN
  Administered 2021-11-26: 5 mL

## 2021-11-26 NOTE — Progress Notes (Signed)
° °  Procedure Note  Patient: Vicki Frazier             Date of Birth: [birth year]           MRN: [id number]             Visit Date: 11/26/2021  Procedures: Visit Diagnoses:  1. Unilateral primary osteoarthritis, left knee     Large Joint Inj: L knee on 11/26/2021 8:58 AM Indications: diagnostic evaluation, joint swelling and pain Details: 18 G 1.5 in needle, superolateral approach  Arthrogram: No  Medications: 5 mL lidocaine 1 % Outcome: tolerated well, no immediate complications Procedure, treatment alternatives, risks and benefits explained, specific risks discussed. Consent was given by the patient. Immediately prior to procedure a time out was called to verify the correct patient, procedure, equipment, support staff and site/side marked as required. Patient was prepped and draped in the usual sterile fashion.   Trivisc  injected from patient.

## 2021-12-03 ENCOUNTER — Ambulatory Visit (INDEPENDENT_AMBULATORY_CARE_PROVIDER_SITE_OTHER): Payer: PRIVATE HEALTH INSURANCE | Admitting: Orthopedic Surgery

## 2021-12-03 ENCOUNTER — Other Ambulatory Visit: Payer: Self-pay

## 2021-12-03 ENCOUNTER — Encounter: Payer: Self-pay | Admitting: Orthopedic Surgery

## 2021-12-03 DIAGNOSIS — M1712 Unilateral primary osteoarthritis, left knee: Secondary | ICD-10-CM

## 2021-12-03 MED ORDER — LIDOCAINE HCL 1 % IJ SOLN
5.0000 mL | INTRAMUSCULAR | Status: AC | PRN
  Administered 2021-12-03: 09:00:00 5 mL

## 2021-12-03 NOTE — Progress Notes (Signed)
° °  Procedure Note  Patient: Vicki Frazier             Date of Birth: 16-Apr-1961           MRN: [id number]             Visit Date: 12/03/2021  Procedures: Visit Diagnoses:  1. Unilateral primary osteoarthritis, left knee     Large Joint Inj: L knee on 12/03/2021 9:18 AM Indications: diagnostic evaluation, joint swelling and pain Details: 18 G 1.5 in needle, superolateral approach  Arthrogram: No  Medications: 5 mL lidocaine 1 % Outcome: tolerated well, no immediate complications Procedure, treatment alternatives, risks and benefits explained, specific risks discussed. Consent was given by the patient. Immediately prior to procedure a time out was called to verify the correct patient, procedure, equipment, support staff and site/side marked as required. Patient was prepped and draped in the usual sterile fashion.    Left knee trivisc injected.  Aspiration 20 cc performed.  Please obtain right knee radiographs next week at the time of her final left knee injection.  Thank you

## 2021-12-10 ENCOUNTER — Ambulatory Visit (INDEPENDENT_AMBULATORY_CARE_PROVIDER_SITE_OTHER): Payer: PRIVATE HEALTH INSURANCE

## 2021-12-10 ENCOUNTER — Other Ambulatory Visit: Payer: Self-pay

## 2021-12-10 ENCOUNTER — Ambulatory Visit (INDEPENDENT_AMBULATORY_CARE_PROVIDER_SITE_OTHER): Payer: PRIVATE HEALTH INSURANCE | Admitting: Surgical

## 2021-12-10 ENCOUNTER — Encounter: Payer: Self-pay | Admitting: Orthopedic Surgery

## 2021-12-10 DIAGNOSIS — M1712 Unilateral primary osteoarthritis, left knee: Secondary | ICD-10-CM | POA: Diagnosis not present

## 2021-12-10 DIAGNOSIS — M25561 Pain in right knee: Secondary | ICD-10-CM | POA: Diagnosis not present

## 2021-12-10 MED ORDER — SODIUM HYALURONATE (VISCOSUP) 25 MG/2.5ML IX SOSY
25.0000 mg | PREFILLED_SYRINGE | INTRA_ARTICULAR | Status: AC | PRN
  Administered 2021-12-10: 25 mg via INTRA_ARTICULAR

## 2021-12-10 MED ORDER — LIDOCAINE HCL 1 % IJ SOLN
5.0000 mL | INTRAMUSCULAR | Status: AC | PRN
  Administered 2021-12-10: 5 mL

## 2021-12-10 NOTE — Progress Notes (Signed)
° °  Procedure Note  Patient: Vicki Frazier             Date of Birth: 10/20/1961           MRN: [id number]             Visit Date: 12/10/2021  Procedures: Visit Diagnoses:  1. Right knee pain, unspecified chronicity     Large Joint Inj: L knee on 12/10/2021 9:18 AM Indications: diagnostic evaluation, joint swelling and pain Details: 18 G 1.5 in needle, superolateral approach  Arthrogram: No  Medications: 5 mL lidocaine 1 %; 25 mg Sodium Hyaluronate 25 MG/2.5ML Outcome: tolerated well, no immediate complications Procedure, treatment alternatives, risks and benefits explained, specific risks discussed. Consent was given by the patient. Immediately prior to procedure a time out was called to verify the correct patient, procedure, equipment, support staff and site/side marked as required. Patient was prepped and draped in the usual sterile fashion.

## 2022-01-20 ENCOUNTER — Encounter: Payer: Self-pay | Admitting: Nurse Practitioner

## 2022-01-20 ENCOUNTER — Ambulatory Visit (INDEPENDENT_AMBULATORY_CARE_PROVIDER_SITE_OTHER): Payer: PRIVATE HEALTH INSURANCE | Admitting: Nurse Practitioner

## 2022-01-20 ENCOUNTER — Other Ambulatory Visit: Payer: Self-pay

## 2022-01-20 VITALS — BP 120/72 | HR 84 | Temp 97.5°F | Ht 66.25 in | Wt 231.2 lb

## 2022-01-20 DIAGNOSIS — M1712 Unilateral primary osteoarthritis, left knee: Secondary | ICD-10-CM

## 2022-01-20 DIAGNOSIS — J4520 Mild intermittent asthma, uncomplicated: Secondary | ICD-10-CM

## 2022-01-20 DIAGNOSIS — Z87312 Personal history of (healed) stress fracture: Secondary | ICD-10-CM | POA: Insufficient documentation

## 2022-01-20 DIAGNOSIS — Z780 Asymptomatic menopausal state: Secondary | ICD-10-CM | POA: Diagnosis not present

## 2022-01-20 DIAGNOSIS — Z800 Family history of malignant neoplasm of digestive organs: Secondary | ICD-10-CM | POA: Insufficient documentation

## 2022-01-20 DIAGNOSIS — Z1211 Encounter for screening for malignant neoplasm of colon: Secondary | ICD-10-CM | POA: Diagnosis not present

## 2022-01-20 DIAGNOSIS — Z1231 Encounter for screening mammogram for malignant neoplasm of breast: Secondary | ICD-10-CM

## 2022-01-20 HISTORY — DX: Unilateral primary osteoarthritis, left knee: M17.12

## 2022-01-20 NOTE — Assessment & Plan Note (Signed)
Controlled ?Trigger acute URI viral illness and pollen ?Used of albuterol prn. ?We discussed need for pneumonia vaccine. She decided not to take vaccine at this time. ?

## 2022-01-20 NOTE — Assessment & Plan Note (Signed)
R. Foot, managed by [facility] ?Acquired while walking ?Postmenopausal, no previous dexa scan ?Agreed to dexa scan order ?Current use of calcium and vit. D ?

## 2022-01-20 NOTE — Progress Notes (Signed)
? ?Subjective:  ?Patient ID: Vicki Frazier, female    DOB: 1961/08/29  Age: 61 y.o. MRN: [id number] ? ?CC: Establish Care (New patient/TOC/Pt states she believes she is due for a mammogram and colonoscopy and would like referrals. ) ? ? ?HPI ? ?FHx: colon cancer ?MGF with colon cancer at age 41 ?Reports last colonoscopy completed at age 70 by [facility] in [place]. Denies hx of colon polyps. ?No personal hx of constipation or diarrhea ? ?Entered referral to GI for repeat colonoscopy ?Also requested report from [place] ? ?Hx of healed stress fracture ?R. Foot, managed by [facility] ?Acquired while walking ?Postmenopausal, no previous dexa scan ?Agreed to dexa scan order ?Current use of calcium and vit. D ? ?Asthma in adult ?Controlled ?Trigger acute URI viral illness and pollen ?Used of albuterol prn. ?We discussed need for pneumonia vaccine. She decided not to take vaccine at this time. ? ?Family History  ?Problem Relation Age of Onset  ? Cancer Father 66  ?     bladder  ? Cancer Sister 85  ?     melanoma  ? Obesity Sister   ? Heart disease Sister   ? Cancer Maternal Grandmother 43  ?     colon cancer  ?  ? ?Reviewed past Medical, Social and Family history today. ? ?Outpatient Medications Prior to Visit  ?Medication Sig Dispense Refill  ? albuterol (VENTOLIN HFA) 108 (90 Base) MCG/ACT inhaler Inhale 2 puffs into the lungs every 6 (six) hours as needed for wheezing or shortness of breath. 8 g 2  ? Calcium-Magnesium-Vitamin D (CALCIUM 1200+D3 PO)     ? cetirizine (ZYRTEC) 10 MG tablet Take 10 mg by mouth daily.    ? fluticasone (FLONASE) 50 MCG/ACT nasal spray Place into both nostrils daily.    ? ibuprofen (ADVIL) 200 MG tablet Take 200 mg by mouth every 6 (six) hours as needed.    ? Multiple Vitamin (MULTI-VITAMIN) tablet     ? cetirizine (ZYRTEC) 10 MG chewable tablet Chew 10 mg by mouth daily.    ? predniSONE (STERAPRED UNI-PAK 21 TAB) 10 MG (21) TBPK tablet Take 6 today, 5 tomorrow, 4 the next  day and then 3, 2, 1 and stop 21 tablet 0  ? ?No facility-administered medications prior to visit.  ? ?ROS ?See HPI ? ?Objective:  ?BP 120/72 (BP Location: Left Arm, Patient Position: Sitting, Cuff Size: Large)   Pulse 84   Temp (!) 97.5 ?F (36.4 ?C) (Temporal)   Ht 5' 6.25" (1.683 m)   Wt 231 lb 3.2 oz (104.9 kg)   SpO2 99%   BMI 37.04 kg/m?  ? ?Physical Exam ?Cardiovascular:  ?   Rate and Rhythm: Normal rate.  ?   Pulses: Normal pulses.  ?Pulmonary:  ?   Effort: Pulmonary effort is normal.  ?Neurological:  ?   Mental Status: She is alert and oriented to person, place, and time.  ? ? ?Assessment & Plan:  ?This visit occurred during the SARS-CoV-2 public health emergency.  Safety protocols were in place, including screening questions prior to the visit, additional usage of staff PPE, and extensive cleaning of exam room while observing appropriate contact time as indicated for disinfecting solutions.  ? ?Vicki Frazier was seen today for establish care. ? ?Diagnoses and all orders for this visit: ? ?Asymptomatic age-related postmenopausal state ?-     DG Bone Density; Future ? ?Hx of healed stress fracture ?-     DG Bone Density; Future ? ?  FHx: colon cancer ?-     Ambulatory referral to Gastroenterology ? ?Colon cancer screening ?-     Ambulatory referral to Gastroenterology ? ?Breast cancer screening by mammogram ?-     MM 3D SCREEN BREAST BILATERAL; Future ? ?Mild intermittent asthma in adult without complication ? ? ? ?Problem List Items Addressed This Visit   ? ?  ? Respiratory  ? Asthma in adult  ?  Controlled ?Trigger acute URI viral illness and pollen ?Used of albuterol prn. ?We discussed need for pneumonia vaccine. She decided not to take vaccine at this time. ?  ?  ?  ? Other  ? FHx: colon cancer  ?  MGF with colon cancer at age 85 ?Reports last colonoscopy completed at age 44 by [place] in [place]. Denies hx of colon polyps. ?No personal hx of constipation or diarrhea ? ?Entered  referral to GI for repeat colonoscopy ?[place] from [place] ?  ?  ? Relevant Orders  ? Ambulatory referral to Gastroenterology  ? Hx of healed stress fracture  ?  R. Foot, managed by [facility] ?Acquired while walking ?Postmenopausal, no previous dexa scan ?Agreed to dexa scan order ?Current use of calcium and vit. D ?  ?  ? Relevant Orders  ? DG Bone Density  ? ?Other Visit Diagnoses   ? ? Asymptomatic age-related postmenopausal state    -  Primary  ? Relevant Orders  ? DG Bone Density  ? Colon cancer screening      ? Relevant Orders  ? Ambulatory referral to Gastroenterology  ? Breast cancer screening by mammogram      ? Relevant Orders  ? MM 3D SCREEN BREAST BILATERAL  ? ?  ?  ?I have spent 53mns with this patient regarding history taking, documentation, review of labs, formulating plan and discussing treatment options with patient. ? ?Follow-up: Return in about 26 weeks (around 07/21/2022) for CPE (fasting). ? ?CWilfred Lacy NP ? ?

## 2022-01-20 NOTE — Patient Instructions (Signed)
Sign medical release form to get records from [facility] (colonoscopy report) ? ?You will be contacted to schedule appt for mammogram, bone density, and GI. ?

## 2022-01-20 NOTE — Assessment & Plan Note (Signed)
MGF with colon cancer at age 61 ?Reports last colonoscopy completed at age 60 by [facility] in [place]. Denies hx of colon polyps. ?No personal hx of constipation or diarrhea ? ?Entered referral to GI for repeat colonoscopy ?Also requested report from [place] ?

## 2022-01-26 ENCOUNTER — Telehealth: Payer: Self-pay | Admitting: Orthopedic Surgery

## 2022-01-26 NOTE — Telephone Encounter (Signed)
Pt had a series of 3 gel injections- and she is still having pain. ? ?What is the next steps ? ?Please call the pt  ?

## 2022-01-28 NOTE — Telephone Encounter (Signed)
Could consider cortisone injection.  Alternatively if these injections are losing their effectiveness then either living with the pain or considering knee replacement would be the next option.

## 2022-01-28 NOTE — Telephone Encounter (Signed)
I called patient and advised. She would like to try cortisone injection again. I made appt for 02/06/2022 at 0900. ?

## 2022-02-06 ENCOUNTER — Ambulatory Visit (INDEPENDENT_AMBULATORY_CARE_PROVIDER_SITE_OTHER): Payer: Self-pay | Admitting: Surgical

## 2022-02-06 ENCOUNTER — Encounter: Payer: Self-pay | Admitting: Orthopedic Surgery

## 2022-02-06 VITALS — Ht 66.25 in | Wt 231.0 lb

## 2022-02-06 DIAGNOSIS — M1712 Unilateral primary osteoarthritis, left knee: Secondary | ICD-10-CM

## 2022-02-09 ENCOUNTER — Encounter: Payer: Self-pay | Admitting: Orthopedic Surgery

## 2022-02-09 MED ORDER — LIDOCAINE HCL 1 % IJ SOLN
5.0000 mL | INTRAMUSCULAR | Status: AC | PRN
  Administered 2022-02-06: 5 mL

## 2022-02-09 MED ORDER — BUPIVACAINE HCL 0.25 % IJ SOLN
4.0000 mL | INTRAMUSCULAR | Status: AC | PRN
  Administered 2022-02-06: 4 mL via INTRA_ARTICULAR

## 2022-02-09 MED ORDER — METHYLPREDNISOLONE ACETATE 40 MG/ML IJ SUSP
40.0000 mg | INTRAMUSCULAR | Status: AC | PRN
  Administered 2022-02-06: 40 mg via INTRA_ARTICULAR

## 2022-02-09 NOTE — Progress Notes (Signed)
? ?Office Visit Note ?  ?Patient: Vicki Frazier           ?Date of Birth: 12-Oct-1961           ?MRN: [id number] ?Visit Date: 02/06/2022 ?Requested by: Flossie Buffy, NP ?[address] ?[place],  [place] [address] ?PCP: Flossie Buffy, NP ? ?Subjective: ?Chief Complaint  ?Patient presents with  ? Right Knee - Pain  ? ? ?HPI: Vicki Frazier is a 61 y.o. female who presents to the office complaining of left knee pain.  Patient recently had series of 3 gel injections with not much relief from these injections.  She is here to discuss knee replacement surgery versus cortisone injection.  Her pain has been worse since October.  Pain is best in the morning and worse in the evening.  She has very occasional groin pain in the last 2 weeks but has never really had groin pain prior to that.  Knee pain has caused her significant discomfort where she can only walk about 1 mile per day currently whereas she was walking 8 to 10 miles daily in the fall.  She has history of left knee MRI from July 2021 demonstrating radial tear of the posterior horn the medial meniscus with meniscal extrusion as well as tricompartmental osteoarthritis.  She has had 1 cortisone injection before that was about 2.5 years ago without much relief.  She also specifically denies any history of diabetes, blood thinner use, smoking, prior surgery to the left knee.  She does not have a cardiologist or any significant cardiac history.Marland Kitchen   ?             ?ROS: All systems reviewed are negative as they relate to the chief complaint within the history of present illness.  Patient denies fevers or chills. ? ?Assessment & Plan: ?Visit Diagnoses:  ?1. Unilateral primary osteoarthritis, left knee   ? ? ?Plan: Patient is a 61 year old female who presents for evaluation of left knee osteoarthritis.  She had recent series of gel injections that did not really provide any lasting relief.  She would like to discuss knee replacement surgery.  This was  discussed in depth with her today including what the surgical procedure entails, what the rehabilitation is like, approximate recovery timeframe.  Discussed the risks and benefits of the procedure including but not limited to the risk of nerve/vessel damage, knee stiffness, knee instability, need for revision surgery, prosthetic joint infection, medical complication from surgery such as DVT/PE/cardiovascular event/death.  After the discussion of knee replacement, patient would eventually like to pursue this but she would like to try cortisone injection today to see if this will have any relief for her since its been 2.5 years since the last injection.  Cortisone injection was successfully administered and patient tolerated the procedure well.  Prior to the injection, did discuss with Marianne that any cortisone injection will delay surgery by about 3 months to limit the risk of PJI.  She understood and will contact the office in about 2 weeks to report how much relief she has from the injection or to request posting for surgery. ? ?Follow-Up Instructions: No follow-ups on file.  ? ?Orders:  ?No orders of the defined types were placed in this encounter. ? ?No orders of the defined types were placed in this encounter. ? ? ? ? Procedures: ?Large Joint Inj: L knee on 02/06/2022 7:38 AM ?Indications: diagnostic evaluation, joint swelling and pain ?Details: 18 G 1.5 in needle, superolateral approach ? ?  Arthrogram: No ? ?Medications: 5 mL lidocaine 1 %; 40 mg methylPREDNISolone acetate 40 MG/ML; 4 mL bupivacaine 0.25 % ?Outcome: tolerated well, no immediate complications ?Procedure, treatment alternatives, risks and benefits explained, specific risks discussed. Consent was given by the patient. Immediately prior to procedure a time out was called to verify the correct patient, procedure, equipment, support staff and site/side marked as required. Patient was prepped and draped in the usual sterile fashion.  ? ? ? ? ?Clinical  Data: ?No additional findings. ? ?Objective: ?Vital Signs: Ht 5' 6.25" (1.683 m)   Wt 231 lb (104.8 kg)   BMI 37.00 kg/m?  ? ?Physical Exam:  ?Constitutional: Patient appears well-developed ?HEENT:  ?Head: Normocephalic ?Eyes:EOM are normal ?Neck: Normal range of motion ?Cardiovascular: Normal rate ?Pulmonary/chest: Effort normal ?Neurologic: Patient is alert ?Skin: Skin is warm ?Psychiatric: Patient has normal mood and affect ? ?Ortho Exam: Ortho exam demonstrates left knee with trace effusion.  Tenderness over the medial and lateral joint lines.  Able to perform straight leg raise without difficulty though it is painful for her against resistance.  No pain with hip range of motion.  No cellulitis noted.  No ecchymosis noted. ? ?Specialty Comments:  ?No specialty comments available. ? ?Imaging: ?No results found. ? ? ?PMFS History: ?Patient Active Problem List  ? Diagnosis Date Noted  ? Hx of healed stress fracture 01/20/2022  ? FHx: colon cancer 01/20/2022  ? Arthritis of knee, left 01/20/2022  ? 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  ? Asthma in adult 11/30/2019  ? Class 3 severe obesity without serious comorbidity with body mass index (BMI) of 40.0 to 44.9 in adult [facility]) 11/30/2019  ? ?Past Medical History:  ?Diagnosis Date  ? Asthma   ? Hypertension   ?  ?Family History  ?Problem Relation Age of Onset  ? Cancer Father 54  ?     bladder  ? Cancer Sister 41  ?     melanoma  ? Obesity Sister   ? Heart disease Sister   ? Cancer Maternal Grandmother 105  ?     colon cancer  ?  ?No past surgical history on file. ?Social History  ? ?Occupational History  ? Not on file  ?Tobacco Use  ? Smoking status: Never  ? Smokeless tobacco: Never  ?Vaping Use  ? Vaping Use: Never used  ?Substance and Sexual Activity  ? Alcohol use: Never  ? Drug use: Never  ? Sexual activity: Not on file  ? ? ? ? ?  ?

## 2022-03-19 NOTE — Telephone Encounter (Signed)
Please request last mammogram. ?

## 2022-03-23 NOTE — Telephone Encounter (Signed)
Please advise 

## 2022-03-27 ENCOUNTER — Encounter: Payer: Self-pay | Admitting: Nurse Practitioner

## 2022-03-27 ENCOUNTER — Ambulatory Visit (INDEPENDENT_AMBULATORY_CARE_PROVIDER_SITE_OTHER): Payer: PRIVATE HEALTH INSURANCE | Admitting: Nurse Practitioner

## 2022-03-27 VITALS — BP 138/78 | HR 60 | Temp 96.8°F | Ht 66.0 in | Wt 238.0 lb

## 2022-03-27 DIAGNOSIS — A6920 Lyme disease, unspecified: Secondary | ICD-10-CM | POA: Diagnosis not present

## 2022-03-27 NOTE — Progress Notes (Signed)
   Acute Office Visit  Subjective:    Patient ID: Vicki Frazier, female    DOB: 1961-09-26, 61 y.o.   MRN: [id number]  Chief Complaint  Patient presents with   Acute Visit    Rash from tick bite x 1 week on left inner thigh, no pain associated , just itchy.     HPI Patient is in today for rash on left thigh after tick bite 1week ago. She was evaluate by urgent care provider within 48hrs and started oral doxycycline '100mg'$  BID x 10days. She reports rash is improving, itching and pain has resolved. She wants to know if current treatment in apropriate. No fever, no myalgia  Outpatient Medications Prior to Visit  Medication Sig   albuterol (VENTOLIN HFA) 108 (90 Base) MCG/ACT inhaler Inhale 2 puffs into the lungs every 6 (six) hours as needed for wheezing or shortness of breath.   Calcium-Magnesium-Vitamin D (CALCIUM 1200+D3 PO)    cetirizine (ZYRTEC) 10 MG tablet Take 10 mg by mouth daily.   Cholecalciferol (VITAMIN D3) 50 MCG (2000 UT) CAPS    doxycycline (VIBRAMYCIN) 100 MG capsule Take 2 capsules by mouth daily.   fluticasone (FLONASE) 50 MCG/ACT nasal spray Place into both nostrils daily.   ibuprofen (ADVIL) 200 MG tablet Take 200 mg by mouth every 6 (six) hours as needed.   Multiple Vitamin (MULTI-VITAMIN) tablet    predniSONE (DELTASONE) 20 MG tablet Take 2 tablets by mouth daily.   No facility-administered medications prior to visit.   Reviewed past medical and social history.  Review of Systems Per HPI     Objective:    Physical Exam Skin:    Findings: Erythema and rash present. Rash is macular.         BP 138/78 (BP Location: Right Arm, Patient Position: Sitting, Cuff Size: Normal)   Pulse 60   Temp (!) 96.8 F (36 C) (Temporal)   Ht '5\' 6"'$  (1.676 m)   Wt 238 lb (108 kg)   SpO2 98%   BMI 38.41 kg/m    No results found for any visits on 03/27/22.     Assessment & Plan:  Provided reassurance, advised to complete oral doxycycline as prescribed.  Problem  List Items Addressed This Visit   None Visit Diagnoses     Early localized Lyme disease    -  Primary   Relevant Medications   doxycycline (VIBRAMYCIN) 100 MG capsule      No orders of the defined types were placed in this encounter.  Return in about 4 months (around 07/28/2022) for CPE(fasting).    Wilfred Lacy, NP

## 2022-03-27 NOTE — Patient Instructions (Signed)
Complete oral doxycycline as prescribed.  Lyme Disease Lyme disease is an infection that can affect many parts of the body, including the skin, joints, and nervous system. It is a bacterial infection that starts from the bite of an infected tick. Over time, the infection can worsen, and some of the symptoms are similar to the flu. If Lyme disease is not treated, it may cause joint pain, swelling, numbness, problems thinking, fatigue, muscle weakness, and other problems. What are the causes? This condition is caused by bacteria called Borrelia burgdorferi. You can get Lyme disease by being bitten by an infected tick. Only black-legged, or Ixodes, ticks that are infected with the bacteria can cause Lyme disease. The tick must be attached to your skin for a certain period of time to pass along the infection. This is usually 36-48 hours. Deer often carry infected ticks. What increases the risk? The following factors may make you more likely to develop this condition: Living in or visiting these areas in the [place]. The [place]. The [place]. Spending time in wooded or grassy areas. Being outdoors with exposed skin. Camping, gardening, hiking, fishing, hunting, or working outdoors. Failing to remove a tick from your skin. What are the signs or symptoms? Symptoms of this condition may include: Chills and fever. Headache. Fatigue. General achiness. Muscle pain. Joint pain, often in the knees. A round, red rash that surrounds the center of the tick bite. The center of the rash may be blood colored or have tiny blisters. Swollen lymph glands. Stiff neck. How is this diagnosed? This condition is diagnosed based on: Your symptoms and medical history. A physical exam. A blood test. How is this treated? The main treatment for this condition is antibiotic medicine, which is usually taken by mouth (orally). The length of treatment depends on how soon after a tick  bite you begin taking the medicine. In some cases, treatment is necessary for several weeks. If the infection is severe, antibiotics may need to be given through an IV that is inserted into one of your veins. Follow these instructions at home: Take over-the-counter and prescription medicines only as told by your health care provider. Finish all antibiotic medicine, even when you start to feel better. Ask your health care provider about taking a probiotic in between doses of your antibiotic to help avoid an upset stomach or diarrhea. Check with your health care provider before supplementing your treatment. Many alternative therapies have not been proven and may be harmful to you. Keep all follow-up visits as told by your health care provider. This is important. How is this prevented? You can become reinfected if you get another tick bite from an infected tick. Take these steps to help prevent an infection: Cover your skin with light-colored clothing when you are outdoors in the spring and summer months. Spray clothing and skin with bug spray. The spray should be 20-30% DEET. You can also treat clothing with permethrin, and let it dry before you wear it. Do not apply permethrin directly to your skin. Permethrin can also be used to treat camping gear and boots. Always read and follow the instructions that come with a bug spray or insecticide. Avoid wooded, grassy, and shaded areas. Remove yard litter, brush, trash, and plants that attract deer and rodents. Check yourself for ticks when you come indoors. Wash clothing worn each day. Shower after spending time outdoors. Check your pets for ticks before they come inside. If you find a tick attached to  your skin: Remove it with tweezers. Clean your hands and the bite area with rubbing alcohol or soap and water. Dispose of the tick by putting it in rubbing alcohol, putting it in a sealed bag or container, or flushing it down the toilet. You may choose  to save the tick in a sealed container if you wish for it to be tested at a later time. Pregnant women should take special care to avoid tick bites because it is possible that the infection may be passed along to the fetus. Contact a health care provider if: You have symptoms after treatment. You have removed a tick and want to bring it to your health care provider for testing. Get help right away if: You have an irregular heartbeat. You have chest pain. You have nerve pain. Your face feels numb. You develop the following: A stiff neck. A severe headache. Severe nausea and vomiting. Sensitivity to light. Summary Lyme disease is an infection that can affect many parts of the body, including the skin, joints, and nervous system. This condition is caused by bacteria called Borrelia burgdorferi. You can get Lyme disease by being bitten by an infected tick. The main treatment for this condition is antibiotic medicine. This information is not intended to replace advice given to you by your health care provider. Make sure you discuss any questions you have with your health care provider. Document Revised: 02/17/2019 Document Reviewed: 01/12/2019 Elsevier Patient Education  [place].

## 2022-04-08 ENCOUNTER — Ambulatory Visit (INDEPENDENT_AMBULATORY_CARE_PROVIDER_SITE_OTHER): Payer: PRIVATE HEALTH INSURANCE | Admitting: Orthopedic Surgery

## 2022-04-08 DIAGNOSIS — M1712 Unilateral primary osteoarthritis, left knee: Secondary | ICD-10-CM

## 2022-04-12 ENCOUNTER — Encounter: Payer: Self-pay | Admitting: Orthopedic Surgery

## 2022-04-12 NOTE — Progress Notes (Signed)
Office Visit Note   Patient: Vicki Frazier           Date of Birth: 02-Mar-1961           MRN: [id number] Visit Date: 04/08/2022 Requested by: Flossie Buffy, NP [place],  [place] [id number] PCP: Flossie Buffy, NP  Subjective: Chief Complaint  Patient presents with   Left Knee - Pain    HPI: Patient presents for continued evaluation and management of left knee pain.  She had an injection in the left knee 02/06/2022 with good relief for 2 weeks.  Symptoms have since recurred.  Reports stiffness as well as constant pain and difficulty with stairs.  The pain does wake her from sleep at night.  She does report some weakness and giving way.  Denies any swelling.  Father has a history of DVT.  She lives with her and her dog at home.  She does have a few steps in and out of her house.  Sister and other family do live close by.  Prior imaging studies are reviewed and she does have a meniscal root tear along with global pain and patellofemoral arthritis which is greater than her medial compartment arthritis.  She is able to bike but she does have a lot of rest pain at night.  She has less than a mile of walking endurance.  She works Games developer at [employer].  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              ROS: All systems reviewed are negative as they relate to the chief complaint within the history of present illness.  Patient denies  fevers or chills.   Assessment & Plan: Visit Diagnoses:  1. Unilateral primary osteoarthritis, left knee     Plan: Impression is left knee arthritis.  After long discussion Cubero would like to proceed with left knee replacement.  The risk and benefits of that procedure are discussed with the patient including but not limited to infection nerve vessel damage incomplete pain relief knee stiffness as well as possibility for perioperative complications including stroke DVT pulmonary embolism heart attack and death.  We  will plan to use Xarelto on her postoperatively and due to her social support may need to keep her 2 nights in the hospital in order to make sure she has adequate mobility to do well at home.  Patient understands risk and benefits and wishes to proceed.  All questions answered  Follow-Up Instructions: No follow-ups on file.   Orders:  No orders of the defined types were placed in this encounter.  No orders of the defined types were placed in this encounter.     Procedures: No procedures performed   Clinical Data: No additional findings.  Objective: Vital Signs: There were no vitals taken for this visit.  Physical Exam:   Constitutional: Patient appears well-developed HEENT:  Head: Normocephalic Eyes:EOM are normal Neck: Normal range of motion Cardiovascular: Normal rate Pulmonary/chest: Effort normal Neurologic: Patient is alert Skin: Skin is warm Psychiatric: Patient has normal mood and affect   Ortho Exam: Ortho exam demonstrates range of motion 0-1 10 with stability to varus and valgus stress at 0 30 and 90 degrees.  Extensor mechanism intact.  Pedal pulses 2+ out of 4.  Ankle dorsiflexion intact.  No groin pain with internal/external rotation on the left-hand side.  Specialty Comments:  No specialty comments available.  Imaging: No results found.   PMFS History:  Patient Active Problem List   Diagnosis Date Noted   Hx of healed stress fracture 01/20/2022   FHx: colon cancer 01/20/2022   Arthritis of knee, left 01/20/2022   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   Asthma in adult 11/30/2019   Class 3 severe obesity without serious comorbidity with body mass index (BMI) of 40.0 to 44.9 in adult [facility]) 11/30/2019   Past Medical History:  Diagnosis Date   Asthma    Hypertension     Family History  Problem Relation Age of Onset   Cancer Father 68       bladder   Cancer Sister 48       melanoma   Obesity Sister    Heart disease Sister     Cancer Maternal Grandmother 80       colon cancer    No past surgical history on file. Social History   Occupational History   Not on file  Tobacco Use   Smoking status: Never   Smokeless tobacco: Never  Vaping Use   Vaping Use: Never used  Substance and Sexual Activity   Alcohol use: Never   Drug use: Never   Sexual activity: Not on file

## 2022-04-20 ENCOUNTER — Other Ambulatory Visit: Payer: Self-pay | Admitting: Nurse Practitioner

## 2022-04-20 DIAGNOSIS — Z780 Asymptomatic menopausal state: Secondary | ICD-10-CM

## 2022-04-20 DIAGNOSIS — Z87312 Personal history of (healed) stress fracture: Secondary | ICD-10-CM

## 2022-04-22 ENCOUNTER — Other Ambulatory Visit: Payer: Self-pay

## 2022-04-27 ENCOUNTER — Encounter: Payer: Self-pay | Admitting: Nurse Practitioner

## 2022-04-27 ENCOUNTER — Ambulatory Visit (INDEPENDENT_AMBULATORY_CARE_PROVIDER_SITE_OTHER): Payer: PRIVATE HEALTH INSURANCE | Admitting: Nurse Practitioner

## 2022-04-27 VITALS — BP 110/74 | HR 66 | Temp 98.3°F | Ht 66.0 in | Wt 236.8 lb

## 2022-04-27 DIAGNOSIS — A6920 Lyme disease, unspecified: Secondary | ICD-10-CM

## 2022-04-27 MED ORDER — DOXYCYCLINE HYCLATE 100 MG PO TABS: 100.0000 mg | ORAL_TABLET | Freq: Two times a day (BID) | ORAL | 0 refills | Status: DC

## 2022-04-27 NOTE — Progress Notes (Signed)
   Acute Office Visit  Subjective:    Patient ID: Vicki Frazier, female    DOB: 1961/04/05, 61 y.o.   MRN: [id number]  Chief Complaint  Patient presents with   Acute Visit    Pt c/o possible tic bite x 5 days some itching. Behind the left knee.    HPI Erythema migrans (Lyme disease) Another tick bit with bullseye rash on left posterior knee. Tick was removed Wednesday last week. No joint pain/ache/myalgia/fever/malaise. She had 2bites with bullseye on same leg 51monthago. Treated with doxycycline x 10days.  Repeat treatment with doxycline. Advised about use of bug repellant. also advised to start probiotic.   Outpatient Medications Prior to Visit  Medication Sig   albuterol (VENTOLIN HFA) 108 (90 Base) MCG/ACT inhaler Inhale 2 puffs into the lungs every 6 (six) hours as needed for wheezing or shortness of breath.   Calcium-Magnesium-Vitamin D (CALCIUM 1200+D3 PO)    cetirizine (ZYRTEC) 10 MG tablet Take 10 mg by mouth daily.   Cholecalciferol (VITAMIN D3) 50 MCG (2000 UT) CAPS    fluticasone (FLONASE) 50 MCG/ACT nasal spray Place into both nostrils daily.   ibuprofen (ADVIL) 200 MG tablet Take 200 mg by mouth every 6 (six) hours as needed.   Multiple Vitamin (MULTI-VITAMIN) tablet    [DISCONTINUED] doxycycline (VIBRAMYCIN) 100 MG capsule Take 2 capsules by mouth daily.   [DISCONTINUED] predniSONE (DELTASONE) 20 MG tablet Take 2 tablets by mouth daily.   No facility-administered medications prior to visit.    Reviewed past medical and social history.  Review of Systems Per HPI     Objective:    Physical Exam  BP 110/74 (BP Location: Right Arm, Patient Position: Sitting, Cuff Size: Large)   Pulse 66   Temp 98.3 F (36.8 C) (Temporal)   Ht '5\' 6"'$  (1.676 m)   Wt 236 lb 12.8 oz (107.4 kg)   SpO2 97%   BMI 38.22 kg/m    No results found for any visits on 04/27/22.      Assessment & Plan:   Problem List Items Addressed This Visit       Musculoskeletal and  Integument   Erythema migrans (Lyme disease) - Primary    Another tick bit with bullseye rash on left posterior knee. Tick was removed Wednesday last week. No joint pain/ache/myalgia/fever/malaise. She had 2bites with bullseye on same leg 153monthgo. Treated with doxycycline x 10days.  Repeat treatment with doxycline. Advised about use of bug repellant. also advised to start probiotic.      Relevant Medications   doxycycline (VIBRA-TABS) 100 MG tablet   Meds ordered this encounter  Medications   doxycycline (VIBRA-TABS) 100 MG tablet    Sig: Take 1 tablet (100 mg total) by mouth 2 (two) times daily.    Dispense:  20 tablet    Refill:  0    Order Specific Question:   Supervising Provider    Answer:   KRLibby [id number]]   Return if symptoms worsen or fail to improve.    ChWilfred LacyNP

## 2022-04-27 NOTE — Patient Instructions (Signed)
Use insect repellant: Murphy's or Repel's lemon eucalyptus oil or Avon original Skin so soft original bath oil. Start Probiotics: Align or curturelle or florastor. (Do not take within 2hrs of oral antibiotics)  Start doxycycline.   Tick Bite Information, Adult  Ticks are insects that can bite. Most ticks live in shrubs and grassy areas. They climb onto people and animals that go by. Then they bite. Some ticks carry germs that can make you sick. How can I prevent tick bites? Take these steps: Use insect repellent Use an insect repellent that has 20% or higher of the ingredients DEET, picaridin, or IR3535. Follow the instructions on the label. Put it on: Bare skin. The tops of your boots. Your pant legs. The ends of your sleeves. If you use an insect repellent that has the ingredient permethrin, follow the instructions on the label. Put it on: Clothing. Boots. Supplies or outdoor gear. Tents. When you are outside Wear long sleeves and long pants. Wear light-colored clothes. Tuck your pant legs into your socks. Stay in the middle of the trail. Do not touch the bushes. Avoid walking through long grass. Check for ticks on your clothes, hair, and skin often while you are outside. Before going inside your house, check your clothes, skin, head, neck, armpits, waist, groin, and joint areas. When you go indoors Check your clothes for ticks. Dry your clothes in a dryer on high heat for 10 minutes or more. If clothes are damp, additional time may be needed. Wash your clothes right away if they need to be washed. Use hot water. Check your pets and outdoor gear. Shower right away. Check your body for ticks. Do a full body check using a mirror. What is the right way to remove a tick? Remove the tick from your skin as soon as possible. Do not remove the tick with your bare fingers. To remove a tick that is crawling on your skin: Go outdoors and brush the tick off. Use tape or a lint  roller. To remove a tick that is biting: Wash your hands. If you have latex gloves, put them on. Use tweezers, curved forceps, or a tick-removal tool to grasp the tick. Grasp the tick as close to your skin and as close to the tick's head as possible. Gently pull up until the tick lets go. Try to keep the tick's head attached to its body. Do not twist or jerk the tick. Do not squeeze or crush the tick. Do not try to remove a tick with heat, alcohol, petroleum jelly, or fingernail polish. What should I do after taking out a tick? Throw away the tick. Do not crush a tick with your fingers. Clean the bite area and your hands with soap and water, rubbing alcohol, or an iodine wash. If an antiseptic cream or ointment is available, apply a small amount to the bite area. Wash and disinfect any instruments that you used to remove the tick. How should I get rid of a live tick? To dispose of a live tick, use one of these methods: Place the tick in rubbing alcohol. Place the tick in a bag or container you can close tightly. Wrap the tick tightly in tape. Flush the tick down the toilet. Contact a doctor if: You have symptoms, such as: A fever or chills. A red rash that makes a circle (bull's-eye rash) in the bite area. Redness and swelling where the tick bit you. Headache. Pain in a muscle, joint, or bone. Being more tired  than normal. Trouble walking or moving your legs. Numbness in your legs. Tender and swollen lymph glands. A part of a tick breaks off and gets stuck in your skin. Get help right away if: You cannot remove a tick. You cannot move (have paralysis) or feel weak. You are feeling worse or have new symptoms. You find a tick that is biting you and filled with blood. This is important if you are in an area where diseases from ticks are common. Summary Ticks may carry germs that can make you sick. To prevent tick bites wear long sleeves, long pants, and light colors. Use insect  repellent. Follow the instructions on the label. If the tick is biting, do not try to remove it with heat, alcohol, petroleum jelly, or fingernail polish. Use tweezers, curved forceps, or a tick-removal tool to grasp the tick. Gently pull up until the tick lets go. Do not twist or jerk the tick. Do not squeeze or crush the tick. If you have symptoms, contact a doctor. This information is not intended to replace advice given to you by your health care provider. Make sure you discuss any questions you have with your health care provider. Document Revised: 10/23/2019 Document Reviewed: 10/23/2019 Elsevier Patient Education  [place].

## 2022-04-27 NOTE — Assessment & Plan Note (Signed)
Another tick bit with bullseye rash on left posterior knee. Tick was removed Wednesday last week. No joint pain/ache/myalgia/fever/malaise. She had 2bites with bullseye on same leg 83monthago. Treated with doxycycline x 10days.  Repeat treatment with doxycline. Advised about use of bug repellant. also advised to start probiotic.

## 2022-05-13 NOTE — Progress Notes (Signed)
Surgical Instructions    Your procedure is scheduled on Thursday July 13.  Report to [facility] Main Entrance "A" at 5:30 A.M., then check in with the Admitting office.  Call this number if you have problems the morning of surgery:  [phone]   If you have any questions prior to your surgery date call [phone]: Open Monday-Friday 8am-4pm    Remember:  Do not eat after midnight the night before your surgery  You may drink clear liquids until 4:30am the morning of your surgery.   Clear liquids allowed are: Water, Non-Citrus Juices (without pulp), Carbonated Beverages, Clear Tea, Black Coffee ONLY (NO MILK, CREAM OR POWDERED CREAMER of any kind), and Gatorade  The night before surgery:  No food after midnight. ONLY clear liquids after midnight  The day of surgery (if you do NOT have diabetes):  Drink ONE (1) Pre-Surgery Clear Ensure by 4:30am the morning of surgery. Drink in one sitting. Do not sip.  This drink was given to you during your hospital  pre-op appointment visit.  Nothing else to drink after completing the  Pre-Surgery Clear Ensure.         If you have questions, please contact your surgeon's office.     Take these medicines the morning of surgery with A SIP OF WATER:  cetirizine (ZYRTEC) doxycycline (VIBRA-TABS)  fluticasone (FLONASE)  If neeeded take: albuterol (VENTOLIN HFA). Please bring inhaler with you when you come for surgery hydrOXYzine (ATARAX)  As of today, STOP taking any Aspirin (unless otherwise instructed by your surgeon) Aleve, Naproxen, Ibuprofen, Motrin, Advil, Goody's, BC's, all herbal medications, fish oil, and all vitamins.           Do not wear jewelry or makeup Do not wear lotions, powders, perfumes/colognes, or deodorant. Do not shave 48 hours prior to surgery.  Men may shave face and neck. Do not bring valuables to the hospital. Do not wear nail polish, gel polish, artificial nails, or any other type of covering on natural nails  (fingers and toes) If you have artificial nails or gel coating that need to be removed by a nail salon, please have this removed prior to surgery. Artificial nails or gel coating may interfere with anesthesia's ability to adequately monitor your vital signs.  [facility] is not responsible for any belongings or valuables. .   Do NOT Smoke (Tobacco/Vaping)  24 hours prior to your procedure  If you use a CPAP at night, you may bring your mask for your overnight stay.   Contacts, glasses, hearing aids, dentures or partials may not be worn into surgery, please bring cases for these belongings   For patients admitted to the hospital, discharge time will be determined by your treatment team.   Patients discharged the day of surgery will not be allowed to drive home, and someone needs to stay with them for 24 hours.   SURGICAL WAITING ROOM VISITATION Patients having surgery or a procedure in a hospital may have two support people. Children under the age of 11 must have an adult with them who is not the patient. They may stay in the waiting area during the procedure and may switch out with other visitors. If the patient needs to stay at the hospital during part of their recovery, the visitor guidelines for inpatient rooms apply.  Please refer to the [facility] website for the visitor guidelines for Inpatients (after your surgery is over and you are in a regular room).       Special  instructions:    Oral Hygiene is also important to reduce your risk of infection.  Remember - BRUSH YOUR TEETH THE MORNING OF SURGERY WITH YOUR REGULAR TOOTHPASTE   [facility]- Preparing For Surgery  Before surgery, you can play an important role. Because skin is not sterile, your skin needs to be as free of germs as possible. You can reduce the number of germs on your skin by washing with CHG (chlorahexidine gluconate) Soap before surgery.  CHG is an antiseptic cleaner which kills germs and bonds with the skin  to continue killing germs even after washing.     Please do not use if you have an allergy to CHG or antibacterial soaps. If your skin becomes reddened/irritated stop using the CHG.  Do not shave (including legs and underarms) for at least 48 hours prior to first CHG shower. It is OK to shave your face.  Please follow these instructions carefully.     Shower the NIGHT BEFORE SURGERY and the MORNING OF SURGERY with CHG Soap.   If you chose to wash your hair, wash your hair first as usual with your normal shampoo. After you shampoo, rinse your hair and body thoroughly to remove the shampoo.  Then [employer] and genitals (private parts) with your normal soap and rinse thoroughly to remove soap.  After that Use CHG Soap as you would any other liquid soap. You can apply CHG directly to the skin and wash gently with a scrungie or a clean washcloth.   Apply the CHG Soap to your body ONLY FROM THE NECK DOWN.  Do not use on open wounds or open sores. Avoid contact with your eyes, ears, mouth and genitals (private parts). Wash Face and genitals (private parts)  with your normal soap.   Wash thoroughly, paying special attention to the area where your surgery will be performed.  Thoroughly rinse your body with warm water from the neck down.  DO NOT shower/wash with your normal soap after using and rinsing off the CHG Soap.  Pat yourself dry with a CLEAN TOWEL.  Wear CLEAN PAJAMAS to bed the night before surgery  Place CLEAN SHEETS on your bed the night before your surgery  DO NOT SLEEP WITH PETS.   Day of Surgery:  Take a shower with CHG soap. Wear Clean/Comfortable clothing the morning of surgery Do not apply any deodorants/lotions.   Remember to brush your teeth WITH YOUR REGULAR TOOTHPASTE.    If you received a COVID test during your pre-op visit, it is requested that you wear a mask when out in public, stay away from anyone that may not be feeling well, and notify your surgeon if you  develop symptoms. If you have been in contact with anyone that has tested positive in the last 10 days, please notify your surgeon.    Please read over the following fact sheets that you were given.

## 2022-05-14 ENCOUNTER — Other Ambulatory Visit: Payer: Self-pay

## 2022-05-14 ENCOUNTER — Encounter (HOSPITAL_COMMUNITY): Payer: Self-pay | Admitting: *Deleted

## 2022-05-14 ENCOUNTER — Encounter (HOSPITAL_COMMUNITY)
Admission: RE | Admit: 2022-05-14 | Discharge: 2022-05-14 | Disposition: A | Discharge status: Home / Self Care | Payer: PRIVATE HEALTH INSURANCE | Source: Ambulatory Visit | Attending: Orthopedic Surgery | Admitting: Orthopedic Surgery

## 2022-05-14 VITALS — BP 122/64 | HR 58 | Temp 98.3°F | Resp 18 | Ht 67.0 in | Wt 234.1 lb

## 2022-05-14 DIAGNOSIS — Z01818 Encounter for other preprocedural examination: Secondary | ICD-10-CM | POA: Diagnosis present

## 2022-05-14 LAB — BASIC METABOLIC PANEL
Anion gap: 8 (ref 5–15)
BUN: 19 mg/dL (ref 6–20)
CO2: 28 mmol/L (ref 22–32)
Calcium: 9.6 mg/dL (ref 8.9–10.3)
Chloride: 106 mmol/L (ref 98–111)
Creatinine, Ser: 0.9 mg/dL (ref 0.44–1.00)
GFR, Estimated: >60 mL/min (ref >60)
Glucose, Bld: 101 mg/dL — ABNORMAL HIGH (ref 70–99)
Potassium: 4.2 mmol/L (ref 3.5–5.1)
Sodium: 142 mmol/L (ref 135–145)

## 2022-05-14 LAB — CBC
HCT: 42.7 % (ref 36.0–46.0)
Hemoglobin: 13.5 g/dL (ref 12.0–15.0)
MCH: 29.3 pg (ref 26.0–34.0)
MCHC: 31.6 g/dL (ref 30.0–36.0)
MCV: 92.8 fL (ref 80.0–100.0)
Platelets: 234 10*3/uL (ref 150–400)
RBC: 4.6 MIL/uL (ref 3.87–5.11)
RDW: 12.8 % (ref 11.5–15.5)
WBC: 6.6 10*3/uL (ref 4.0–10.5)
nRBC: 0 % (ref 0.0–0.2)

## 2022-05-14 LAB — URINALYSIS, COMPLETE (UACMP) WITH MICROSCOPIC
Bacteria, UA: NONE SEEN
Bilirubin Urine: NEGATIVE
Glucose, UA: NEGATIVE mg/dL
Hgb urine dipstick: NEGATIVE
Ketones, ur: NEGATIVE mg/dL
Leukocytes,Ua: NEGATIVE
Nitrite: NEGATIVE
Protein, ur: NEGATIVE mg/dL
Specific Gravity, Urine: 1.016 (ref 1.005–1.030)
pH: 5 (ref 5.0–8.0)

## 2022-05-14 LAB — SURGICAL PCR SCREEN
MRSA, PCR: NEGATIVE
Staphylococcus aureus: NEGATIVE

## 2022-05-14 NOTE — Progress Notes (Signed)
PCP - [facility] Cardiologist - denies  PPM/ICD - denies Device Orders -  Rep Notified -   Chest x-ray - none EKG - 05/14/22- pt has history of HTN,but she says she no longer has it;she is not treated for HTN currently. Stress Test - none ECHO - none Cardiac Cath - none  Sleep Study - none CPAP - no  Fasting Blood Sugar - na Checks Blood Sugar _____ times a day  Blood Thinner Instructions:na Aspirin Instructions:na  ERAS Protcol - clear liquids until 0430 PRE-SURGERY Ensure or G2- ensure  COVID TEST- no   Anesthesia review: no  Patient denies shortness of breath, fever, cough and chest pain at PAT appointment   All instructions explained to the patient, with a verbal understanding of the material. Patient agrees to go over the instructions while at home for a better understanding. Patient also instructed to wear a mask when out in public prior to surgery. The opportunity to ask questions was provided.

## 2022-05-15 LAB — URINE CULTURE

## 2022-05-20 MED ORDER — TRANEXAMIC ACID 1000 MG/10ML IV SOLN
2000.0000 mg | INTRAVENOUS | Status: DC
  Filled 2022-05-20: qty 20

## 2022-05-20 NOTE — Anesthesia Preprocedure Evaluation (Addendum)
Anesthesia Evaluation  Patient identified by MRN, date of birth, ID band Patient awake    Reviewed: Allergy & Precautions, NPO status , Patient's Chart, lab work & pertinent test results  History of Anesthesia Complications Negative for: history of anesthetic complications  Airway Mallampati: II  TM Distance: >3 FB Neck ROM: Full    Dental  (+) Dental Advisory Given   Pulmonary asthma , COPD,  COPD inhaler,    breath sounds clear to auscultation       Cardiovascular hypertension (no longer on BP meds), (-) angina Rhythm:Regular Rate:Normal     Neuro/Psych negative neurological ROS     GI/Hepatic negative GI ROS, Neg liver ROS,   Endo/Other  Morbid obesity  Renal/GU negative Renal ROS     Musculoskeletal  (+) Arthritis ,   Abdominal (+) + obese,   Peds  Hematology negative hematology ROS (+)   Anesthesia Other Findings   Reproductive/Obstetrics                            Anesthesia Physical Anesthesia Plan  ASA: 3  Anesthesia Plan: Spinal   Post-op Pain Management: Regional block* and Tylenol PO (pre-op)*   Induction: Intravenous  PONV Risk Score and Plan: 2 and Ondansetron and Dexamethasone  Airway Management Planned: Simple Face Mask and Natural Airway  Additional Equipment: None  Intra-op Plan:   Post-operative Plan:   Informed Consent: I have reviewed the patients History and Physical, chart, labs and discussed the procedure including the risks, benefits and alternatives for the proposed anesthesia with the patient or authorized representative who has indicated his/her understanding and acceptance.     Dental advisory given  Plan Discussed with: CRNA and Surgeon  Anesthesia Plan Comments: (Plan SAB with adductor canal block for post op analgesia)       Anesthesia Quick Evaluation

## 2022-05-21 ENCOUNTER — Observation Stay (HOSPITAL_COMMUNITY)
Admission: RE | Admit: 2022-05-21 | Discharge: 2022-05-22 | Disposition: A | Discharge status: Home Health | Payer: 59 | Attending: Orthopedic Surgery | Admitting: Orthopedic Surgery

## 2022-05-21 ENCOUNTER — Other Ambulatory Visit: Payer: Self-pay

## 2022-05-21 ENCOUNTER — Ambulatory Visit (HOSPITAL_COMMUNITY): Payer: 59 | Admitting: Anesthesiology

## 2022-05-21 ENCOUNTER — Encounter (HOSPITAL_COMMUNITY)
Admission: RE | Disposition: A | Discharge status: Home Health | Payer: Self-pay | Source: Home / Self Care | Attending: Orthopedic Surgery

## 2022-05-21 ENCOUNTER — Encounter (HOSPITAL_COMMUNITY): Payer: Self-pay | Admitting: Orthopedic Surgery

## 2022-05-21 DIAGNOSIS — J449 Chronic obstructive pulmonary disease, unspecified: Secondary | ICD-10-CM | POA: Diagnosis not present

## 2022-05-21 DIAGNOSIS — I10 Essential (primary) hypertension: Secondary | ICD-10-CM

## 2022-05-21 DIAGNOSIS — R7309 Other abnormal glucose: Secondary | ICD-10-CM | POA: Diagnosis not present

## 2022-05-21 DIAGNOSIS — M1712 Unilateral primary osteoarthritis, left knee: Secondary | ICD-10-CM | POA: Diagnosis present

## 2022-05-21 DIAGNOSIS — J45909 Unspecified asthma, uncomplicated: Secondary | ICD-10-CM | POA: Insufficient documentation

## 2022-05-21 DIAGNOSIS — M179 Osteoarthritis of knee, unspecified: Secondary | ICD-10-CM | POA: Diagnosis present

## 2022-05-21 DIAGNOSIS — Z6836 Body mass index (BMI) 36.0-36.9, adult: Secondary | ICD-10-CM

## 2022-05-21 DIAGNOSIS — Z01818 Encounter for other preprocedural examination: Principal | ICD-10-CM

## 2022-05-21 DIAGNOSIS — Z96652 Presence of left artificial knee joint: Secondary | ICD-10-CM

## 2022-05-21 HISTORY — PX: TOTAL KNEE ARTHROPLASTY: SHX125

## 2022-05-21 LAB — GLUCOSE, CAPILLARY: Glucose-Capillary: 110 mg/dL — ABNORMAL HIGH (ref 70–99)

## 2022-05-21 SURGERY — ARTHROPLASTY, KNEE, TOTAL
Anesthesia: Spinal | Site: Knee | Laterality: Left

## 2022-05-21 MED ORDER — CEFAZOLIN SODIUM-DEXTROSE 2-4 GM/100ML-% IV SOLN
2.0000 g | INTRAVENOUS | Status: AC
  Administered 2022-05-21: 2 g via INTRAVENOUS
  Filled 2022-05-21: qty 100

## 2022-05-21 MED ORDER — BUPIVACAINE IN DEXTROSE 0.75-8.25 % IT SOLN
INTRATHECAL | Status: DC | PRN
  Administered 2022-05-21: 13.5 mg via INTRATHECAL

## 2022-05-21 MED ORDER — METHOCARBAMOL 1000 MG/10ML IJ SOLN: 500.0000 mg | Freq: Four times a day (QID) | INTRAVENOUS | Status: DC | PRN

## 2022-05-21 MED ORDER — METOCLOPRAMIDE HCL 5 MG/ML IJ SOLN: 5.0000 mg | Freq: Three times a day (TID) | INTRAMUSCULAR | Status: DC | PRN

## 2022-05-21 MED ORDER — DEXAMETHASONE SODIUM PHOSPHATE 10 MG/ML IJ SOLN
INTRAMUSCULAR | Status: AC
  Filled 2022-05-21: qty 1

## 2022-05-21 MED ORDER — ACETAMINOPHEN 325 MG PO TABS
325.0000 mg | ORAL_TABLET | Freq: Four times a day (QID) | ORAL | Status: DC | PRN
  Administered 2022-05-22: 650 mg via ORAL
  Filled 2022-05-21: qty 2

## 2022-05-21 MED ORDER — ACETAMINOPHEN 500 MG PO TABS
1000.0000 mg | ORAL_TABLET | Freq: Once | ORAL | Status: AC
Start: 2022-05-21 — End: 2022-05-21
  Administered 2022-05-21: 1000 mg via ORAL
  Filled 2022-05-21: qty 2

## 2022-05-21 MED ORDER — MIDAZOLAM HCL 2 MG/2ML IJ SOLN
INTRAMUSCULAR | Status: DC | PRN
  Administered 2022-05-21: 1 mg via INTRAVENOUS

## 2022-05-21 MED ORDER — HYDROMORPHONE HCL 1 MG/ML IJ SOLN
INTRAMUSCULAR | Status: AC
  Filled 2022-05-21: qty 1

## 2022-05-21 MED ORDER — TRANEXAMIC ACID 1000 MG/10ML IV SOLN
INTRAVENOUS | Status: DC | PRN
  Administered 2022-05-21: 2000 mg via TOPICAL

## 2022-05-21 MED ORDER — FLUTICASONE PROPIONATE 50 MCG/ACT NA SUSP
1.0000 | Freq: Every day | NASAL | Status: DC
Start: 2022-05-22 — End: 2022-05-22
  Filled 2022-05-21: qty 16

## 2022-05-21 MED ORDER — ALBUTEROL SULFATE (2.5 MG/3ML) 0.083% IN NEBU
3.0000 mL | INHALATION_SOLUTION | Freq: Four times a day (QID) | RESPIRATORY_TRACT | Status: DC | PRN
Start: 2022-05-21 — End: 2022-05-22

## 2022-05-21 MED ORDER — DEXMEDETOMIDINE HCL IN NACL 80 MCG/20ML IV SOLN
INTRAVENOUS | Status: AC
Start: 2022-05-21 — End: ?
  Filled 2022-05-21: qty 20

## 2022-05-21 MED ORDER — IRRISEPT - 450ML BOTTLE WITH 0.05% CHG IN STERILE WATER, USP 99.95% OPTIME
TOPICAL | Status: DC | PRN
  Administered 2022-05-21: 450 mL

## 2022-05-21 MED ORDER — DEXMEDETOMIDINE (PRECEDEX) IN NS 20 MCG/5ML (4 MCG/ML) IV SYRINGE
PREFILLED_SYRINGE | INTRAVENOUS | Status: DC | PRN
  Administered 2022-05-21: 4 ug via INTRAVENOUS

## 2022-05-21 MED ORDER — METOCLOPRAMIDE HCL 5 MG PO TABS: 5.0000 mg | ORAL_TABLET | Freq: Three times a day (TID) | ORAL | Status: DC | PRN

## 2022-05-21 MED ORDER — PROPOFOL 10 MG/ML IV BOLUS
INTRAVENOUS | Status: DC | PRN
  Administered 2022-05-21: 30 mg via INTRAVENOUS

## 2022-05-21 MED ORDER — 0.9 % SODIUM CHLORIDE (POUR BTL) OPTIME
TOPICAL | Status: DC | PRN
  Administered 2022-05-21: 4000 mL

## 2022-05-21 MED ORDER — OXYCODONE HCL 5 MG/5ML PO SOLN: 5.0000 mg | Freq: Once | ORAL | Status: AC | PRN

## 2022-05-21 MED ORDER — CLONIDINE HCL (ANALGESIA) 100 MCG/ML EP SOLN
EPIDURAL | Status: AC
Start: 2022-05-21 — End: ?
  Filled 2022-05-21: qty 10

## 2022-05-21 MED ORDER — PHENOL 1.4 % MT LIQD: 1.0000 | OROMUCOSAL | Status: DC | PRN

## 2022-05-21 MED ORDER — ORAL CARE MOUTH RINSE
15.0000 mL | Freq: Once | OROMUCOSAL | Status: AC
Start: 2022-05-21 — End: 2022-05-21

## 2022-05-21 MED ORDER — OXYCODONE HCL 5 MG PO TABS
ORAL_TABLET | ORAL | Status: AC
  Filled 2022-05-21: qty 1

## 2022-05-21 MED ORDER — METHOCARBAMOL 500 MG PO TABS
500.0000 mg | ORAL_TABLET | Freq: Four times a day (QID) | ORAL | Status: DC | PRN
  Administered 2022-05-21: 500 mg via ORAL
  Filled 2022-05-21: qty 1

## 2022-05-21 MED ORDER — PHENYLEPHRINE HCL-NACL 20-0.9 MG/250ML-% IV SOLN
INTRAVENOUS | Status: DC | PRN
  Administered 2022-05-21: 40 ug/min via INTRAVENOUS

## 2022-05-21 MED ORDER — OXYCODONE HCL 5 MG PO TABS: 5.0000 mg | ORAL_TABLET | ORAL | Status: DC | PRN

## 2022-05-21 MED ORDER — CLONIDINE HCL (ANALGESIA) 100 MCG/ML EP SOLN: EPIDURAL | Status: DC | PRN

## 2022-05-21 MED ORDER — BUPIVACAINE HCL (PF) 0.25 % IJ SOLN
INTRAMUSCULAR | Status: AC
Start: 2022-05-21 — End: ?
  Filled 2022-05-21: qty 30

## 2022-05-21 MED ORDER — LACTATED RINGERS IV SOLN: INTRAVENOUS | Status: DC

## 2022-05-21 MED ORDER — ONDANSETRON HCL 4 MG/2ML IJ SOLN
INTRAMUSCULAR | Status: DC | PRN
  Administered 2022-05-21: 4 mg via INTRAVENOUS

## 2022-05-21 MED ORDER — RIVAROXABAN 10 MG PO TABS
10.0000 mg | ORAL_TABLET | Freq: Every day | ORAL | Status: DC
  Administered 2022-05-22: 10 mg via ORAL
  Filled 2022-05-21: qty 1

## 2022-05-21 MED ORDER — DEXAMETHASONE SODIUM PHOSPHATE 10 MG/ML IJ SOLN
INTRAMUSCULAR | Status: DC | PRN
  Administered 2022-05-21: 10 mg via INTRAVENOUS

## 2022-05-21 MED ORDER — ONDANSETRON HCL 4 MG/2ML IJ SOLN: 4.0000 mg | Freq: Four times a day (QID) | INTRAMUSCULAR | Status: DC | PRN

## 2022-05-21 MED ORDER — ONDANSETRON HCL 4 MG PO TABS: 4.0000 mg | ORAL_TABLET | Freq: Four times a day (QID) | ORAL | Status: DC | PRN

## 2022-05-21 MED ORDER — HYDROMORPHONE HCL 1 MG/ML IJ SOLN
0.2500 mg | INTRAMUSCULAR | Status: DC | PRN
  Administered 2022-05-21 (×4): 0.5 mg via INTRAVENOUS

## 2022-05-21 MED ORDER — OXYCODONE HCL 5 MG PO TABS
5.0000 mg | ORAL_TABLET | Freq: Once | ORAL | Status: AC | PRN
  Administered 2022-05-21: 5 mg via ORAL

## 2022-05-21 MED ORDER — POVIDONE-IODINE 10 % EX SWAB
2.0000 | Freq: Once | CUTANEOUS | Status: AC
  Administered 2022-05-21: 2 via TOPICAL

## 2022-05-21 MED ORDER — DOCUSATE SODIUM 100 MG PO CAPS
100.0000 mg | ORAL_CAPSULE | Freq: Two times a day (BID) | ORAL | Status: DC
  Administered 2022-05-21 – 2022-05-22 (×2): 100 mg via ORAL
  Filled 2022-05-21 (×2): qty 1

## 2022-05-21 MED ORDER — PROMETHAZINE HCL 25 MG/ML IJ SOLN
6.2500 mg | INTRAMUSCULAR | Status: DC | PRN
  Administered 2022-05-21: 12.5 mg via INTRAVENOUS

## 2022-05-21 MED ORDER — CHLORHEXIDINE GLUCONATE 0.12 % MT SOLN
OROMUCOSAL | Status: AC
  Administered 2022-05-21: 15 mL via OROMUCOSAL
  Filled 2022-05-21: qty 15

## 2022-05-21 MED ORDER — MIDAZOLAM HCL 2 MG/2ML IJ SOLN
INTRAMUSCULAR | Status: AC
Start: 2022-05-21 — End: ?
  Filled 2022-05-21: qty 2

## 2022-05-21 MED ORDER — BUPIVACAINE LIPOSOME 1.3 % IJ SUSP
INTRAMUSCULAR | Status: AC
Start: 2022-05-21 — End: ?
  Filled 2022-05-21: qty 20

## 2022-05-21 MED ORDER — MENTHOL 3 MG MT LOZG: 1.0000 | LOZENGE | OROMUCOSAL | Status: DC | PRN

## 2022-05-21 MED ORDER — CELECOXIB 100 MG PO CAPS
100.0000 mg | ORAL_CAPSULE | Freq: Two times a day (BID) | ORAL | Status: DC
  Administered 2022-05-21 – 2022-05-22 (×3): 100 mg via ORAL
  Filled 2022-05-21 (×4): qty 1

## 2022-05-21 MED ORDER — ACETAMINOPHEN 500 MG PO TABS
1000.0000 mg | ORAL_TABLET | Freq: Four times a day (QID) | ORAL | Status: AC
  Administered 2022-05-21 – 2022-05-22 (×3): 1000 mg via ORAL
  Filled 2022-05-21 (×3): qty 2

## 2022-05-21 MED ORDER — ROPIVACAINE HCL 7.5 MG/ML IJ SOLN
INTRAMUSCULAR | Status: DC | PRN
  Administered 2022-05-21: 20 mL via PERINEURAL

## 2022-05-21 MED ORDER — MEPERIDINE HCL 25 MG/ML IJ SOLN: 6.2500 mg | INTRAMUSCULAR | Status: DC | PRN

## 2022-05-21 MED ORDER — HYDROMORPHONE HCL 1 MG/ML IJ SOLN: 0.5000 mg | INTRAMUSCULAR | Status: DC | PRN

## 2022-05-21 MED ORDER — POVIDONE-IODINE 10 % EX SWAB: 2.0000 | Freq: Once | CUTANEOUS | Status: AC

## 2022-05-21 MED ORDER — LORATADINE 10 MG PO TABS
10.0000 mg | ORAL_TABLET | Freq: Every day | ORAL | Status: DC
  Administered 2022-05-22: 10 mg via ORAL
  Filled 2022-05-21: qty 1

## 2022-05-21 MED ORDER — FENTANYL CITRATE (PF) 250 MCG/5ML IJ SOLN
INTRAMUSCULAR | Status: DC | PRN
  Administered 2022-05-21: 50 ug via INTRAVENOUS

## 2022-05-21 MED ORDER — POVIDONE-IODINE 7.5 % EX SOLN
Freq: Once | CUTANEOUS | Status: DC
  Filled 2022-05-21: qty 118

## 2022-05-21 MED ORDER — SODIUM CHLORIDE 0.9 % IR SOLN
Status: DC | PRN
  Administered 2022-05-21: 2000 mL
  Administered 2022-05-21: 1000 mL

## 2022-05-21 MED ORDER — PROMETHAZINE HCL 25 MG/ML IJ SOLN
INTRAMUSCULAR | Status: AC
  Filled 2022-05-21: qty 1

## 2022-05-21 MED ORDER — VANCOMYCIN HCL 1000 MG IV SOLR
INTRAVENOUS | Status: DC | PRN
  Administered 2022-05-21: 1000 mg

## 2022-05-21 MED ORDER — MORPHINE SULFATE (PF) 4 MG/ML IV SOLN
INTRAVENOUS | Status: AC
  Filled 2022-05-21: qty 2

## 2022-05-21 MED ORDER — MIDAZOLAM HCL 2 MG/2ML IJ SOLN: 0.5000 mg | Freq: Once | INTRAMUSCULAR | Status: DC | PRN

## 2022-05-21 MED ORDER — CEFAZOLIN SODIUM-DEXTROSE 2-4 GM/100ML-% IV SOLN
2.0000 g | Freq: Three times a day (TID) | INTRAVENOUS | Status: AC
  Administered 2022-05-21 (×2): 2 g via INTRAVENOUS
  Filled 2022-05-21 (×2): qty 100

## 2022-05-21 MED ORDER — ONDANSETRON HCL 4 MG/2ML IJ SOLN
INTRAMUSCULAR | Status: AC
  Filled 2022-05-21: qty 2

## 2022-05-21 MED ORDER — PROPOFOL 500 MG/50ML IV EMUL
INTRAVENOUS | Status: DC | PRN
  Administered 2022-05-21: 50 ug/kg/min via INTRAVENOUS

## 2022-05-21 MED ORDER — SODIUM CHLORIDE (PF) 0.9 % IJ SOLN: INTRAMUSCULAR | Status: DC | PRN

## 2022-05-21 MED ORDER — CHLORHEXIDINE GLUCONATE 0.12 % MT SOLN
15.0000 mL | Freq: Once | OROMUCOSAL | Status: AC
Start: 2022-05-21 — End: 2022-05-21

## 2022-05-21 MED ORDER — SODIUM CHLORIDE 0.9 % IV SOLN: INTRAVENOUS | Status: AC

## 2022-05-21 MED ORDER — FENTANYL CITRATE (PF) 250 MCG/5ML IJ SOLN
INTRAMUSCULAR | Status: AC
  Filled 2022-05-21: qty 5

## 2022-05-21 MED ORDER — VANCOMYCIN HCL 1000 MG IV SOLR
INTRAVENOUS | Status: AC
  Filled 2022-05-21: qty 20

## 2022-05-21 MED ORDER — TRANEXAMIC ACID-NACL 1000-0.7 MG/100ML-% IV SOLN
1000.0000 mg | INTRAVENOUS | Status: AC
  Administered 2022-05-21: 1000 mg via INTRAVENOUS
  Filled 2022-05-21: qty 100

## 2022-05-21 SURGICAL SUPPLY — 72 items
BAG COUNTER SPONGE SURGICOUNT (BAG) ×2 IMPLANT
BAG DECANTER FOR FLEXI CONT (MISCELLANEOUS) ×2 IMPLANT
BANDAGE ESMARK 6X9 LF (GAUZE/BANDAGES/DRESSINGS) ×1 IMPLANT
BLADE SAG 18X100X1.27 (BLADE) ×2 IMPLANT
BNDG COHESIVE 6X5 TAN STRL LF (GAUZE/BANDAGES/DRESSINGS) ×2 IMPLANT
BNDG ELASTIC 6X15 VLCR STRL LF (GAUZE/BANDAGES/DRESSINGS) ×2 IMPLANT
BNDG ELASTIC 6X5.8 VLCR STR LF (GAUZE/BANDAGES/DRESSINGS) ×2 IMPLANT
BNDG ESMARK 6X9 LF (GAUZE/BANDAGES/DRESSINGS) ×2
BOWL SMART MIX CTS (DISPOSABLE) IMPLANT
CNTNR URN SCR LID CUP LEK RST (MISCELLANEOUS) ×1 IMPLANT
COMP FEM KNEE PS NRW 8 LT (Joint) ×2 IMPLANT
COMP PATELLAR 10X35 METAL (Joint) ×2 IMPLANT
COMP TIB KNEE PS 0D LT (Joint) ×2 IMPLANT
COMPONENT FEM KNEE PS NRW 8 LT (Joint) IMPLANT
COMPONENT PATELLAR 10X35 METAL (Joint) IMPLANT
COMPONENT TIB KNEE PS 0D LT (Joint) IMPLANT
CONT SPEC 4OZ STRL OR WHT (MISCELLANEOUS) ×2
COOLER ICEMAN CLASSIC (MISCELLANEOUS) ×1 IMPLANT
COVER SURGICAL LIGHT HANDLE (MISCELLANEOUS) ×2 IMPLANT
CUFF TOURN SGL QUICK 34 (TOURNIQUET CUFF) ×2
CUFF TOURN SGL QUICK 42 (TOURNIQUET CUFF) IMPLANT
CUFF TRNQT CYL 34X4.125X (TOURNIQUET CUFF) ×1 IMPLANT
DRAPE INCISE IOBAN 66X45 STRL (DRAPES) IMPLANT
DRAPE ORTHO SPLIT 77X108 STRL (DRAPES) ×6
DRAPE SURG ORHT 6 SPLT 77X108 (DRAPES) ×3 IMPLANT
DRAPE U-SHAPE 47X51 STRL (DRAPES) ×2 IMPLANT
DRSG AQUACEL AG ADV 3.5X14 (GAUZE/BANDAGES/DRESSINGS) ×1 IMPLANT
DURAPREP 26ML APPLICATOR (WOUND CARE) ×4 IMPLANT
ELECT CAUTERY BLADE 6.4 (BLADE) ×2 IMPLANT
ELECT REM PT RETURN 9FT ADLT (ELECTROSURGICAL) ×2
ELECTRODE REM PT RTRN 9FT ADLT (ELECTROSURGICAL) ×1 IMPLANT
GLOVE BIOGEL PI IND STRL 7.0 (GLOVE) ×1 IMPLANT
GLOVE BIOGEL PI IND STRL 8 (GLOVE) ×1 IMPLANT
GLOVE BIOGEL PI INDICATOR 7.0 (GLOVE) ×1
GLOVE BIOGEL PI INDICATOR 8 (GLOVE) ×1
GLOVE ECLIPSE 7.0 STRL STRAW (GLOVE) ×2 IMPLANT
GLOVE ECLIPSE 8.0 STRL XLNG CF (GLOVE) ×2 IMPLANT
GLOVE SURG ENC MOIS LTX SZ6.5 (GLOVE) ×6 IMPLANT
GOWN STRL REUS W/ TWL LRG LVL3 (GOWN DISPOSABLE) ×3 IMPLANT
GOWN STRL REUS W/TWL LRG LVL3 (GOWN DISPOSABLE) ×6
HANDPIECE INTERPULSE COAX TIP (DISPOSABLE) ×2
HDLS TROCR DRIL PIN KNEE 75 (PIN) ×2
HOOD PEEL AWAY FLYTE STAYCOOL (MISCELLANEOUS) ×7 IMPLANT
IMMOBILIZER KNEE 22 UNIV (SOFTGOODS) ×1 IMPLANT
KIT BASIN OR (CUSTOM PROCEDURE TRAY) ×2 IMPLANT
KIT TURNOVER KIT B (KITS) ×2 IMPLANT
MANIFOLD NEPTUNE II (INSTRUMENTS) ×2 IMPLANT
NDL SPNL 18GX3.5 QUINCKE PK (NEEDLE) ×1 IMPLANT
NEEDLE 22X1 1/2 (OR ONLY) (NEEDLE) ×4 IMPLANT
NEEDLE SPNL 18GX3.5 QUINCKE PK (NEEDLE) ×2 IMPLANT
NS IRRIG 1000ML POUR BTL (IV SOLUTION) ×4 IMPLANT
PACK TOTAL JOINT (CUSTOM PROCEDURE TRAY) ×2 IMPLANT
PAD ARMBOARD 7.5X6 YLW CONV (MISCELLANEOUS) ×4 IMPLANT
PIN DRILL HDLS TROCAR 75 4PK (PIN) IMPLANT
SCREW FEMALE HEX FIX 25X2.5 (ORTHOPEDIC DISPOSABLE SUPPLIES) ×1 IMPLANT
SET HNDPC FAN SPRY TIP SCT (DISPOSABLE) ×1 IMPLANT
STEM TIBIAL 10 8-11 EF POLY LT (Joint) ×1 IMPLANT
STRIP CLOSURE SKIN 1/2X4 (GAUZE/BANDAGES/DRESSINGS) ×1 IMPLANT
SUCTION FRAZIER HANDLE 10FR (MISCELLANEOUS) ×2
SUCTION TUBE FRAZIER 10FR DISP (MISCELLANEOUS) ×1 IMPLANT
SUT MNCRL AB 3-0 PS2 18 (SUTURE) ×2 IMPLANT
SUT VIC AB 0 CT1 27 (SUTURE) ×10
SUT VIC AB 0 CT1 27XBRD ANBCTR (SUTURE) ×3 IMPLANT
SUT VIC AB 1 CT1 27 (SUTURE) ×12
SUT VIC AB 1 CT1 27XBRD ANBCTR (SUTURE) IMPLANT
SUT VIC AB 2-0 CT1 27 (SUTURE) ×14
SUT VIC AB 2-0 CT1 TAPERPNT 27 (SUTURE) ×4 IMPLANT
SYR 30ML LL (SYRINGE) ×6 IMPLANT
SYR TB 1ML LUER SLIP (SYRINGE) ×2 IMPLANT
TOWEL GREEN STERILE (TOWEL DISPOSABLE) ×4 IMPLANT
TOWEL GREEN STERILE FF (TOWEL DISPOSABLE) ×4 IMPLANT
YANKAUER SUCT BULB TIP NO VENT (SUCTIONS) ×2 IMPLANT

## 2022-05-21 NOTE — Evaluation (Signed)
Physical Therapy Evaluation Patient Details Name: Vicki Frazier MRN: [id number] DOB: 1961-01-29 Today's Date: 05/21/2022  History of Present Illness  Pt is a 61 y/o female s/p L TKA. PMH includes HTN and asthma.  Clinical Impression  Pt admitted secondary to problem above with deficits below. Pt requiring min guard to stand and transfer to [facility] and then to chair. Pt reporting some lightheadedness/dizziness so further mobility deferred. Reviewed knee precautions with pt. Reports sister plans to stay with her at d/c.Will continue to follow acutely.      Recommendations for follow up therapy are one component of a multi-disciplinary discharge planning process, led by the attending physician.  Recommendations may be updated based on patient status, additional functional criteria and insurance authorization.  Follow Up Recommendations Follow physician's recommendations for discharge plan and follow up therapies      Assistance Recommended at Discharge Intermittent Supervision/Assistance  Patient can return home with the following  Assist for transportation;Assistance with cooking/housework    Equipment Recommendations BSC/3in1  Recommendations for Other Services       Functional Status Assessment Patient has had a recent decline in their functional status and demonstrates the ability to make significant improvements in function in a reasonable and predictable amount of time.     Precautions / Restrictions Precautions Precautions: Knee Precaution Booklet Issued: No Precaution Comments: Verbally reviewed knee precautions. Restrictions Weight Bearing Restrictions: Yes LLE Weight Bearing: Weight bearing as tolerated      Mobility  Bed Mobility Overal bed mobility: Needs Assistance Bed Mobility: Supine to Sit     Supine to sit: Min guard     General bed mobility comments: Min guard for safety.    Transfers Overall transfer level: Needs assistance Equipment used: Rolling  walker (2 wheels) Transfers: Sit to/from Stand, Bed to chair/wheelchair/BSC Sit to Stand: Min guard Stand pivot transfers: Min guard         General transfer comment: Performed stand pivot transfer to [facility] and then to chair. Pt with increased dizziness, so further mobility limited. Min guard for safety.    Ambulation/Gait                  Stairs            Wheelchair Mobility    Modified Rankin (Stroke Patients Only)       Balance Overall balance assessment: Needs assistance Sitting-balance support: No upper extremity supported, Feet supported Sitting balance-Leahy Scale: Good     Standing balance support: Bilateral upper extremity supported Standing balance-Leahy Scale: Poor Standing balance comment: Reliant on BUE support                             Pertinent Vitals/Pain Pain Assessment Pain Assessment: Faces Faces Pain Scale: Hurts even more Pain Location: L knee Pain Descriptors / Indicators: Guarding, Grimacing Pain Intervention(s): Monitored during session, Limited activity within patient's tolerance, Repositioned    Home Living Family/patient expects to be discharged to:: Private residence Living Arrangements: Alone Available Help at Discharge: Family;Available 24 hours/day (sister will be staying with her) Type of Home: House Home Access: Stairs to enter Entrance Stairs-Rails: Chemical engineer of Steps: 4-5   Home Layout: Two level;Able to live on main level with bedroom/bathroom Home Equipment: Rolling Walker (2 wheels);Tub bench      Prior Function Prior Level of Function : Independent/Modified Independent  Hand Dominance        Extremity/Trunk Assessment   Upper Extremity Assessment Upper Extremity Assessment: Overall WFL for tasks assessed    Lower Extremity Assessment Lower Extremity Assessment: LLE deficits/detail LLE Deficits / Details: deficits consistent with post  op pain and weakness.    Cervical / Trunk Assessment Cervical / Trunk Assessment: Normal  Communication   Communication: No difficulties  Cognition Arousal/Alertness: Awake/alert Behavior During Therapy: WFL for tasks assessed/performed Overall Cognitive Status: Within Functional Limits for tasks assessed                                          General Comments      Exercises     Assessment/Plan    PT Assessment Patient needs continued PT services  PT Problem List Decreased strength;Decreased range of motion;Decreased activity tolerance;Decreased balance;Decreased mobility;Decreased knowledge of use of DME;Decreased knowledge of precautions;Pain       PT Treatment Interventions DME instruction;Gait training;Stair training;Functional mobility training;Therapeutic activities;Therapeutic exercise;Balance training;Patient/family education    PT Goals (Current goals can be found in the Care Plan section)  Acute Rehab PT Goals Patient Stated Goal: to go home PT Goal Formulation: With patient Time For Goal Achievement: 06/04/22 Potential to Achieve Goals: Good    Frequency 7X/week     Co-evaluation               AM-PAC PT "6 Clicks" Mobility  Outcome Measure Help needed turning from your back to your side while in a flat bed without using bedrails?: A Little Help needed moving from lying on your back to sitting on the side of a flat bed without using bedrails?: A Little Help needed moving to and from a bed to a chair (including a wheelchair)?: A Little Help needed standing up from a chair using your arms (e.g., wheelchair or bedside chair)?: A Little Help needed to walk in hospital room?: A Little Help needed climbing 3-5 steps with a railing? : A Little 6 Click Score: 18    End of Session Equipment Utilized During Treatment: Gait belt Activity Tolerance: Treatment limited secondary to medical complications (Comment) (dizziness) Patient left: in  chair;with call bell/phone within reach;with family/visitor present Nurse Communication: Mobility status PT Visit Diagnosis: Other abnormalities of gait and mobility (R26.89);Difficulty in walking, not elsewhere classified (R26.2);Pain Pain - Right/Left: Left Pain - part of body: Knee    Time: 4401-0272 PT Time Calculation (min) (ACUTE ONLY): 26 min   Charges:   PT Evaluation $PT Eval Low Complexity: 1 Low PT Treatments $Therapeutic Activity: 8-22 mins        Lou Miner, DPT  Acute Rehabilitation Services  Office: [phone]   Rudean Hitt 05/21/2022, 4:30 PM

## 2022-05-21 NOTE — Brief Op Note (Signed)
   05/21/2022  10:53 AM  PATIENT:  Berneice Gandy  61 y.o. female  PRE-OPERATIVE DIAGNOSIS:  left knee osteoarthritis  POST-OPERATIVE DIAGNOSIS:  left knee osteoarthritis  PROCEDURE:  Procedure(s): LEFT TOTAL KNEE ARTHROPLASTY  SURGEON:  Surgeon(s): Meredith Pel, MD  ASSISTANT: magnant pa  ANESTHESIA:   spinal  EBL: 50 ml    Total I/O In: 1500 [I.V.:1300; IV Piggyback:200] Out: 1150 [Urine:1100; Blood:50]  BLOOD ADMINISTERED: none  DRAINS: none   LOCAL MEDICATIONS USED:  marcaine mso4 clonidine  SPECIMEN:  No Specimen  COUNTS:  YES  TOURNIQUET:   Total Tourniquet Time Documented: Thigh (Left) - -37396 minutes Total: Thigh (Left) - -37396 minutes   DICTATION: .Other Dictation: Dictation Number [id number]  PLAN OF CARE: Admit for overnight observation  PATIENT DISPOSITION:  PACU - hemodynamically stable

## 2022-05-21 NOTE — Progress Notes (Signed)
Called Dr. Marlou Sa and made him aware that patient's urine culture showed multiple species present. No new orders at this time.

## 2022-05-21 NOTE — Plan of Care (Addendum)
Patient ambulated to bathroom well with standby assist. Patient tolerated CPM for 2 hours very well. Ice man applied and patient educated. Incentive spirometer provided and patient understands how to use. Patient stable. Will continue to monitor.    Problem: Education: Goal: Knowledge of General Education information will improve Description: Including pain rating scale, medication(s)/side effects and non-pharmacologic comfort measures Outcome: Progressing   Problem: Activity: Goal: Risk for activity intolerance will decrease Outcome: Progressing   Problem: Pain Managment: Goal: General experience of comfort will improve Outcome: Progressing   Problem: Safety: Goal: Ability to remain free from injury will improve Outcome: Progressing   Problem: Skin Integrity: Goal: Risk for impaired skin integrity will decrease Outcome: Progressing

## 2022-05-21 NOTE — Anesthesia Procedure Notes (Signed)
Anesthesia Regional Block: Adductor canal block   Pre-Anesthetic Checklist: , timeout performed,  Correct Patient, Correct Site, Correct Laterality,  Correct Procedure, Correct Position, site marked,  Risks and benefits discussed,  Surgical consent,  Pre-op evaluation,  At surgeon's request and post-op pain management  Laterality: Left and Lower  Prep: chloraprep       Needles:  Injection technique: Single-shot  Needle Type: Echogenic Needle     Needle Length: 9cm  Needle Gauge: 21     Additional Needles:   Procedures:,,,, ultrasound used (permanent image in chart),,    Narrative:  Start time: 05/21/2022 7:06 AM End time: 05/21/2022 7:12 AM Injection made incrementally with aspirations every 5 mL.  Performed by: Personally  Anesthesiologist: Annye Asa, MD  Additional Notes: Pt identified in Holding room.  Monitors applied. Working IV access confirmed. Sterile prep L thigh.  #21ga ECHOgenic Arrow block needle into adductor canal with US guidance.  20cc 0.75% Ropivacaine injected incrementally after negative test dose.  Patient asymptomatic, VSS, no heme aspirated, tolerated well.   Jenita Seashore, MD

## 2022-05-21 NOTE — Progress Notes (Signed)
ANTICOAGULATION CONSULT NOTE - Initial Consult  Pharmacy Consult for xarelto Indication:  s/p TKR  Allergies  Allergen Reactions   Molds & Smuts     Other reaction(s): Eye Redness   Grass Pollen(K-O-R-T-Swt Vern) Other (See Comments)    Itchy eyes   Other Itching    Animal dander Runny nose itchy eyes   Peanut Allergen Powder-Dnfp Itching    Runny nose itchy eyes    Vital Signs: Temp: 97 F (36.1 C) (07/13 1020) Temp Source: Oral (07/13 0541) BP: 101/68 (07/13 1200) Pulse Rate: 57 (07/13 1200)  Labs: No results for input(s): "HGB", "HCT", "PLT", "APTT", "LABPROT", "INR", "HEPARINUNFRC", "HEPRLOWMOCWT", "CREATININE", "CKTOTAL", "CKMB", "TROPONINIHS" in the last 72 hours.  Estimated Creatinine Clearance: 83.3 mL/min (by C-G formula based on SCr of 0.9 mg/dL).   Medical History: Past Medical History:  Diagnosis Date   Asthma    Hypertension     Medications:  Medications Prior to Admission  Medication Sig Dispense Refill Last Dose   Calcium Carb-Cholecalciferol (CALCIUM 600+D) 600-20 MG-MCG TABS Take 1 tablet by mouth daily.   Past Week   cetirizine (ZYRTEC) 10 MG tablet Take 10 mg by mouth daily.   05/21/2022 at 0430   Cholecalciferol (VITAMIN D3) 50 MCG (2000 UT) CAPS Take 2,000 Units by mouth 2 (two) times daily.   Past Week   doxycycline (VIBRA-TABS) 100 MG tablet Take 1 tablet (100 mg total) by mouth 2 (two) times daily. 20 tablet 0 Past Month   fluticasone (FLONASE) 50 MCG/ACT nasal spray Place 1 spray into both nostrils daily.   05/21/2022 at 0430   hydrOXYzine (ATARAX) 50 MG tablet Take 50 mg by mouth daily as needed for itching.   Past Month   ibuprofen (ADVIL) 200 MG tablet Take 400 mg by mouth 2 (two) times daily.   Past Week   Lactobacillus Rhamnosus, GG, (CULTURELLE) CAPS Take 1 capsule by mouth daily.   Past Week   Multiple Vitamin (MULTI-VITAMIN) tablet Take 1 tablet by mouth daily. Woman   Past Week   mupirocin ointment (BACTROBAN) 2 % Apply 1 Application  topically 3 (three) times daily.      albuterol (VENTOLIN HFA) 108 (90 Base) MCG/ACT inhaler Inhale 2 puffs into the lungs every 6 (six) hours as needed for wheezing or shortness of breath. (Patient taking differently: Inhale 2 puffs into the lungs every 6 (six) hours as needed (Asthma).) 8 g 2 More than a month    Assessment: 8 YOF admitted for a TKR ordered on Xarelto for VTE prophylaxis  Goal of Therapy:  Monitor platelets by anticoagulation protocol: Yes   Plan:  Start Xarelto 10 mg daily on 05/22/2022 24 hours post-op Monitor for signs of bleeding Pharmacy will sign off on the consult but continue to monitor and make recommendations prn  Thank you for the consult  Vaughan Basta BS, PharmD, BCPS Clinical Pharmacist 05/21/2022 1:32 PM  Contact: [phone] after 3 PM  "Be curious, not judgmental..." -Jamal Maes

## 2022-05-21 NOTE — Progress Notes (Signed)
Orthopedic Tech Progress Note Patient Details:  Vicki Frazier 19-Oct-1961 [id number]  Ortho Devices Type of Ortho Device: Bone foam zero knee Ortho Device/Splint Interventions: Ordered   Post Interventions Patient Tolerated: Well  Linus Salmons Kailea Dannemiller 05/21/2022, 2:39 PM

## 2022-05-21 NOTE — H&P (Signed)
TOTAL KNEE ADMISSION H&P  Patient is being admitted for left total knee arthroplasty.  Subjective:  Chief Complaint:left knee pain.  HPI: Vicki Frazier, 61 y.o. female, has a history of pain and functional disability in the left knee due to arthritis and has failed non-surgical conservative treatments for greater than 12 weeks to includeNSAID's and/or analgesics, corticosteriod injections, and activity modification.  Onset of symptoms was gradual, starting 4 years ago with gradually worsening course since that time. The patient noted no past surgery on the left knee(s).  Patient currently rates pain in the left knee(s) at 7 out of 10 with activity. Patient has night pain, worsening of pain with activity and weight bearing, pain that interferes with activities of daily living, pain with passive range of motion, crepitus, and joint swelling.  Patient has evidence of subchondral sclerosis and joint space narrowing by imaging studies. This patient has had  meniscal root tear in the face of pre-existing medial compartment arthritis along with severe patellofemoral arthritis on MRI scan.  There is a positive family history of DVT with the patient's father. There is no active infection.  Patient Active Problem List   Diagnosis Date Noted   Erythema migrans (Lyme disease) 04/27/2022   Hx of healed stress fracture 01/20/2022   FHx: colon cancer 01/20/2022   Arthritis of knee, left 01/20/2022   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   Asthma in adult 11/30/2019   Class 3 severe obesity without serious comorbidity with body mass index (BMI) of 40.0 to 44.9 in adult [facility]) 11/30/2019   Past Medical History:  Diagnosis Date   Asthma    Hypertension     History reviewed. No pertinent surgical history.  Current Facility-Administered Medications  Medication Dose Route Frequency Provider Last Rate Last Admin   ceFAZolin (ANCEF) IVPB 2g/100 mL premix  2 g Intravenous On Call to OR Magnant,  Gerrianne Scale, PA-C       lactated ringers infusion   Intravenous Continuous Annye Asa, MD       povidone-iodine (BETADINE) 7.5 % scrub   Topical Once Magnant, Charles L, PA-C       tranexamic acid (CYKLOKAPRON) 2,000 mg in sodium chloride 0.9 % 50 mL Topical Application  9,024 mg Topical To OR Meredith Pel, MD       tranexamic acid (CYKLOKAPRON) IVPB 1,000 mg  1,000 mg Intravenous To OR Magnant, Charles L, PA-C       Allergies  Allergen Reactions   Molds & Smuts     Other reaction(s): Eye Redness   Grass Pollen(K-O-R-T-Swt Vern) Other (See Comments)    Itchy eyes   Other Itching    Animal dander Runny nose itchy eyes   Peanut Allergen Powder-Dnfp Itching    Runny nose itchy eyes    Social History   Tobacco Use   Smoking status: Never   Smokeless tobacco: Never  Substance Use Topics   Alcohol use: Never    Family History  Problem Relation Age of Onset   Cancer Father 43       bladder   Cancer Sister 9       melanoma   Obesity Sister    Heart disease Sister    Cancer Maternal Grandmother 80       colon cancer     Review of Systems  Musculoskeletal:  Positive for arthralgias.  All other systems reviewed and are negative.   Objective:  Physical Exam Vitals reviewed.  HENT:     Head:  Normocephalic.     Nose: Nose normal.     Mouth/Throat:     Mouth: Mucous membranes are moist.  Eyes:     Pupils: Pupils are equal, round, and reactive to light.  Cardiovascular:     Rate and Rhythm: Normal rate.     Pulses: Normal pulses.  Pulmonary:     Effort: Pulmonary effort is normal.  Abdominal:     General: Abdomen is flat.  Musculoskeletal:     Cervical back: Normal range of motion.  Skin:    General: Skin is warm.     Capillary Refill: Capillary refill takes less than 2 seconds.  Neurological:     General: No focal deficit present.     Mental Status: She is alert.  Psychiatric:        Mood and Affect: Mood normal.    Ortho exam demonstrates left  knee with positive effusion.  Tenderness to palpation of the medial joint line.  Mild pain with side-to-side motion of the patella.  0 degrees extension, greater than 90 degrees of flexion.  No tenderness palpation of the lateral joint line.  No groin pain elicited with hip range of motion of the left hip.  Negative straight leg raise.  Extensor mechanism intact of the left knee.  DP pulse 2 out of 4.  Ankle dorsiflexion intact  Vital signs in last 24 hours: Temp:  [97.7 F (36.5 C)] 97.7 F (36.5 C) (07/13 0541) Pulse Rate:  [63] 63 (07/13 0541) BP: (129-138)/(83-91) 138/83 (07/13 0625) SpO2:  [99 %] 99 % (07/13 0541)  Labs:   Estimated body mass index is 36.67 kg/m as calculated from the following:   Height as of 05/14/22: '5\' 7"'$  (1.702 m).   Weight as of 05/14/22: 106.2 kg.   Imaging Review Plain radiographs demonstrate moderate degenerative joint disease of the left knee(s). The overall alignment isneutral. The bone quality appears to be good for age and reported activity level.      Assessment/Plan:  End stage arthritis, left knee   The patient history, physical examination, clinical judgment of the provider and imaging studies are consistent with end stage degenerative joint disease of the left knee(s) and total knee arthroplasty is deemed medically necessary. The treatment options including medical management, injection therapy arthroscopy and arthroplasty were discussed at length. The risks and benefits of total knee arthroplasty were presented and reviewed. The risks due to aseptic loosening, infection, stiffness, patella tracking problems, thromboembolic complications and other imponderables were discussed. The patient acknowledged the explanation, agreed to proceed with the plan and consent was signed. Patient is being admitted for inpatient treatment for surgery, pain control, PT, OT, prophylactic antibiotics, VTE prophylaxis, progressive ambulation and ADL's and discharge  planning. The patient is planning to be discharged home with home health services     Patient's anticipated LOS is less than 2 midnights, meeting these requirements: - Younger than 49 - Lives within 1 hour of care - Has a competent adult at home to recover with post-op recover - NO history of  - Chronic pain requiring opiods  - Diabetes  - Coronary Artery Disease  - Heart failure  - Heart attack  - Stroke  - DVT/VTE  - Cardiac arrhythmia  - Respiratory Failure/COPD  - Renal failure  - Anemia  - Advanced Liver disease

## 2022-05-21 NOTE — Op Note (Signed)
NAMEKORIANA, STEPIEN MEDICAL RECORD NO: [id number] ACCOUNT NO: [id number] DATE OF BIRTH: [birth year] FACILITY: [facility] LOCATION: [facility] PHYSICIAN: Yetta Barre. Marlou Sa, MD  Operative Report   DATE OF PROCEDURE: 05/21/2022  PREOPERATIVE DIAGNOSIS:  Left knee arthritis.  POSTOPERATIVE DIAGNOSIS:  Left knee arthritis.  PROCEDURE:  Left total knee replacement using Zimmer press-fit posterior cruciate-retaining size 8 narrow femur, 35 mm press-fit patella, size F press-fit tibia with 10 mm highly cross-linked polyethylene medial congruent insert.  SURGEON:  Yetta Barre. Marlou Sa, MD  ASSISTANT:  Annie Main, PA.  INDICATIONS:  The patient is a 61 year old patient with left knee arthritis refractory to nonoperative management, who presents for operative management after explanation of risks and benefits.  DESCRIPTION OF PROCEDURE:  The patient was brought to the operating room where spinal anesthetic was induced.  Preoperative antibiotics administered.  Timeout was called.  Left knee prescrubbed with alcohol and Betadine, allowed to air dry, prepped with  DuraPrep solution and draped in a sterile manner.  Ioban used to cover the operative field.  The leg was elevated and exsanguinated with the Esmarch wrap.  Timeout was called.  Anterior approach to the knee was made.  Skin and subcutaneous tissue was  sharply divided.  IrriSept solution utilized.  Median parapatellar arthrotomy was made and marked with #1 Vicryl suture.  IrriSept solution utilized in the joint itself.  Fat pad partially removed.  Medial soft tissue dissection performed proportionate  to the patient's very mild preoperative deformity.  Lateral patellofemoral ligament was released.  Soft tissue removed from the anterior distal femur. Knee was flexed, patella everted.  Severe tricompartmental arthritis was present, worse in the  patellofemoral compartment.  Anterior horn of the lateral meniscus was released.  ACL was released.  Osteophytes were  removed. Tibial spines were rongeured to a flattened surface.  Intramedullary alignment was then utilized and a cut was made 10 mm off  the least affected lateral tibial plateau perpendicular to the mechanical axis. Collaterals and posterior neurovascular structures were protected.  Bone quality was excellent.  The femur was then cut 5 degrees of valgus using intramedullary alignment.  A  10 mm cut was made.  A 10 mm spacer block fit nicely within the extension gap with full extension and good stability and extension achieved.  Femur was sized to a size 8, narrow.  Anterior, posterior and chamfer cuts were made with the collaterals  protected.  Tibial baseplate was placed then, which was size F.  This fit nicely on the tibial plateau.  This was screwed into the bone and a trial femur was placed.  A 10 mm poly was placed.  Patella was then cut down from 22 to 12 mm and a trial  patellar button was placed.  Same patellar thickness was maintained.  Next, the patient had full extension, full flexion.  Lateral release was performed to improve patellar tracking, which was slightly lateral, but did not subluxate with flexion.   Following this, the patient did have full flexion and extension with excellent patellar tracking using no thumbs technique.  Next, tibia was keel punched and prepared.  Trial components were removed.  Thorough irrigation was performed with 3 liters of  irrigating solution, the capsule was anesthetized using a combination of Marcaine, saline and Exparel.  Next tranexamic acid sponge was allowed to sit in the knee for 3 minutes along with IrriSept solution.  This was removed.  Vancomycin powder was then  placed within the tibial canal.  The components were then  tapped into position with excellent press fit obtained. With a 10 mm spacer the patient had full extension and full flexion, excellent patellar tracking with no thumbs technique and very good  stability to varus and valgus stress at 0,  30 and 90 degrees, tourniquet was released, bleeding points encountered controlled using electrocautery, 3 liters of pouring irrigation solution utilized at this time.  Median parapatellar arthrotomy was closed  over bolster using #1 Vicryl suture.  Prior to final closure, the knee joint was again irrigated with IrriSept solution.  Vancomycin powder placed.  Then, the arthrotomy was closed completely and the knee joint was anesthetized using a combination of  Marcaine, morphine and clonidine.  At that time the incision was closed using 0 Vicryl suture, 2-0 Vicryl suture, and 3-0 Monocryl with Steri-Strips and Aquacel dressing applied.  Ace wrap, iceman and knee immobilizer placed.  Luke's assistance was  required at all times during the case for retraction, opening, closing, mobilization of tissue, drilling, sawing.  His assistance was a medical necessity at all times during the case.   PUS D: 05/21/2022 11:00:08 am T: 05/21/2022 1:44:00 pm  JOB: [id number]/ [id number]

## 2022-05-21 NOTE — Anesthesia Postprocedure Evaluation (Signed)
Anesthesia Post Note  Patient: Public relations account executive  Procedure(s) Performed: LEFT TOTAL KNEE ARTHROPLASTY (Left: Knee)     Patient location during evaluation: PACU Anesthesia Type: Spinal Level of consciousness: awake and alert, patient cooperative and oriented Pain management: pain level controlled Vital Signs Assessment: post-procedure vital signs reviewed and stable Respiratory status: spontaneous breathing, nonlabored ventilation and respiratory function stable Cardiovascular status: blood pressure returned to baseline and stable Postop Assessment: no apparent nausea or vomiting and spinal receding Anesthetic complications: no   No notable events documented.  Last Vitals:  Vitals:   05/21/22 1030 05/21/22 1045  BP: (!) 111/56 112/79  Pulse: (!) 57 (!) 54  Resp: 16 13  Temp:    SpO2: 95% 96%    Last Pain:  Vitals:   05/21/22 1100  TempSrc:   PainSc: 4                  Gordy Goar,E. Diedra Sinor

## 2022-05-21 NOTE — Anesthesia Procedure Notes (Signed)
Spinal  Patient location during procedure: OR End time: 05/21/2022 7:50 AM Reason for block: surgical anesthesia Staffing Performed: anesthesiologist  Anesthesiologist: Annye Asa, MD Performed by: Annye Asa, MD Authorized by: Annye Asa, MD   Preanesthetic Checklist Completed: patient identified, IV checked, site marked, risks and benefits discussed, surgical consent, monitors and equipment checked, pre-op evaluation and timeout performed Spinal Block Patient position: sitting Prep: DuraPrep and site prepped and draped Patient monitoring: blood pressure, continuous pulse ox, cardiac monitor and heart rate Approach: midline Location: L3-4 Injection technique: single-shot Needle Needle type: Pencan and Introducer  Needle gauge: 24 G Needle length: 9 cm Assessment Events: CSF return Additional Notes Pt identified in Operating room.  Monitors applied. Working IV access confirmed. Sterile prep, drape lumbar spine.  1% lido local L 3,4.  #24ga Pencan into clear CSF L 3,4.  13.5 mg 0.75% Bupivacaine with dextrose injected with asp CSF beginning and end of injection.  Patient asymptomatic, VSS, no heme aspirated, tolerated well.  Jenita Seashore, MD

## 2022-05-21 NOTE — Progress Notes (Signed)
Orthopedic Tech Progress Note Patient Details:  Vicki Frazier 08-21-1961 [id number]  CPM Left Knee CPM Left Knee: On Left Knee Flexion (Degrees): 40 Left Knee Extension (Degrees): 10  Post Interventions Patient Tolerated: Well  Vicki Frazier [place] Shannahan 05/21/2022, 11:54 AM

## 2022-05-21 NOTE — Transfer of Care (Signed)
Immediate Anesthesia Transfer of Care Note  Patient: Vicki Frazier  Procedure(s) Performed: LEFT TOTAL KNEE ARTHROPLASTY (Left: Knee)  Patient Location: PACU  Anesthesia Type:Spinal  Level of Consciousness: awake, alert  and oriented  Airway & Oxygen Therapy: Patient Spontanous Breathing  Post-op Assessment: Report given to RN, Post -op Vital signs reviewed and stable, Patient moving all extremities X 4 and Patient able to stick tongue midline  Post vital signs: Reviewed  Last Vitals:  Vitals Value Taken Time  BP 126/78   Temp 36.8   Pulse 57 05/21/22 1019  Resp 17 05/21/22 1019  SpO2 95 % 05/21/22 1019  Vitals shown include unvalidated device data.  Last Pain:  Vitals:   05/21/22 0622  TempSrc:   PainSc: 0-No pain      Patients Stated Pain Goal: 0 (59/09/31 1216)  Complications: No notable events documented.

## 2022-05-21 NOTE — Discharge Instructions (Signed)

## 2022-05-22 ENCOUNTER — Other Ambulatory Visit (HOSPITAL_COMMUNITY): Payer: Self-pay

## 2022-05-22 ENCOUNTER — Telehealth (HOSPITAL_COMMUNITY): Payer: Self-pay | Admitting: Pharmacy Technician

## 2022-05-22 DIAGNOSIS — M1712 Unilateral primary osteoarthritis, left knee: Secondary | ICD-10-CM | POA: Diagnosis not present

## 2022-05-22 LAB — CBC
HCT: 39.7 % (ref 36.0–46.0)
Hemoglobin: 12.9 g/dL (ref 12.0–15.0)
MCH: 30.1 pg (ref 26.0–34.0)
MCHC: 32.5 g/dL (ref 30.0–36.0)
MCV: 92.8 fL (ref 80.0–100.0)
Platelets: 246 10*3/uL (ref 150–400)
RBC: 4.28 MIL/uL (ref 3.87–5.11)
RDW: 12.9 % (ref 11.5–15.5)
WBC: 15.7 10*3/uL — ABNORMAL HIGH (ref 4.0–10.5)
nRBC: 0 % (ref 0.0–0.2)

## 2022-05-22 LAB — URINE CULTURE: Culture: NO GROWTH

## 2022-05-22 MED ORDER — RIVAROXABAN 10 MG PO TABS: 10.0000 mg | ORAL_TABLET | Freq: Every day | ORAL | 0 refills | Status: DC

## 2022-05-22 MED ORDER — OXYCODONE HCL 5 MG PO TABS
5.0000 mg | ORAL_TABLET | ORAL | 0 refills | Status: DC | PRN
Start: 2022-05-22 — End: 2022-05-25

## 2022-05-22 MED ORDER — DOCUSATE SODIUM 100 MG PO CAPS: 100.0000 mg | ORAL_CAPSULE | Freq: Two times a day (BID) | ORAL | 0 refills | Status: DC

## 2022-05-22 MED ORDER — METHOCARBAMOL 500 MG PO TABS: 500.0000 mg | ORAL_TABLET | Freq: Three times a day (TID) | ORAL | 0 refills | Status: DC | PRN

## 2022-05-22 NOTE — TOC Transition Note (Signed)
Transition of Care [facility]) - CM/SW Discharge Note   Patient Details  Name: Vicki Frazier MRN: [id number] Date of Birth: [birth year]  [facility] CM/SW Contact:  Sharin Mons, RN Phone Number: 05/22/2022, 1:11 PM   Clinical Narrative:    Patient will DC to: home Anticipated DC date: 05/22/2022 Family notified: yes Transport by: car       S/p Left total knee replacement, 7/13  Per MD patient ready for DC today. RN, patient, and patient's family notified of DC. Pt from home alone. Sister to assist with care once d/c. Pt agreeable to home health services. Pt without preference. Referral made with [facility] and accepted. Pt without DME needs. States aleady has RW and CPM @ home. Sister to provide transportation to home. Pt without Rx med concern.  Post hospital f/u noted on AVS.  RNCM will sign off for now as intervention is no longer needed. Please consult [place] again if new needs arise.     Final next level of care: [place] Barriers to Discharge: No Barriers Identified   Patient Goals and CMS Choice     Choice offered to / list presented to : Patient  Discharge Placement    Discharge Plan and Services      HH Arranged: PT [facility] Agency: [place] Date [place]: 05/22/22 Time [place]: [id number] Representative spoke with at [place]: Tommi Rumps  Social Determinants of Health ([place]) Interventions     Readmission Risk Interventions     No data to display

## 2022-05-22 NOTE — Telephone Encounter (Signed)
Pharmacy Patient Advocate Encounter  Insurance verification completed.    The patient is insured through [employer]   The patient is currently admitted and ran test claims for the following: Xarelto.  Copays and coinsurance results were relayed to Inpatient clinical team.

## 2022-05-22 NOTE — TOC Benefit Eligibility Note (Signed)
Patient Teacher, English as a foreign language completed.    The patient is currently admitted and upon discharge could be taking Xarelto 10 mg.  The current 30 day co-pay is, $30.00.   The patient is insured through [place], [place] Patient Advocate Specialist [place] Patient Advocate Team Direct Number: [phone]  Fax: [phone]

## 2022-05-22 NOTE — Plan of Care (Signed)
  Problem: Education: Goal: Knowledge of General Education information will improve Description: Including pain rating scale, medication(s)/side effects and non-pharmacologic comfort measures Outcome: Progressing   Problem: Health Behavior/Discharge Planning: Goal: Ability to manage health-related needs will improve Outcome: Progressing   Problem: Clinical Measurements: Goal: Ability to maintain clinical measurements within normal limits will improve Outcome: Progressing Goal: Will remain free from infection Outcome: Progressing Goal: Respiratory complications will improve Outcome: Progressing   Problem: Nutrition: Goal: Adequate nutrition will be maintained Outcome: Progressing   Problem: Coping: Goal: Level of anxiety will decrease Outcome: Progressing   Problem: Elimination: Goal: Will not experience complications related to bowel motility Outcome: Progressing   Problem: Pain Managment: Goal: General experience of comfort will improve Outcome: Progressing   Problem: Safety: Goal: Ability to remain free from injury will improve Outcome: Progressing   Problem: Skin Integrity: Goal: Risk for impaired skin integrity will decrease Outcome: Progressing

## 2022-05-22 NOTE — Progress Notes (Signed)
Physical Therapy Treatment Patient Details Name: Vicki Frazier MRN: [id number] DOB: [birth year] Today's Date: 05/22/2022   History of Present Illness Pt is a 61 y/o female s/p L TKA. PMH includes HTN and asthma.    PT Comments    Pt admitted with above diagnosis. Pt was able to complete all aspects for education to include bed mobility, transfers, ambulation, stair navigation and exercises.  Answered all pts questions and pt states she feels ready to go home.  Messaged MD and CM.  Gave handouts as well.  Pt currently with functional limitations due to balance and endurance deficits. Pt will benefit from skilled PT to increase their independence and safety with mobility to allow discharge to the venue listed below.      Recommendations for follow up therapy are one component of a multi-disciplinary discharge planning process, led by the attending physician.  Recommendations may be updated based on patient status, additional functional criteria and insurance authorization.  Follow Up Recommendations  Follow physician's recommendations for discharge plan and follow up therapies (HHPT recommended initially so that pt can practice her stairs as she has a different set up and would benefit from PT advice. Then transition to Outpt PT)     Assistance Recommended at Discharge Intermittent Supervision/Assistance  Patient can return home with the following Assist for transportation;Assistance with cooking/housework   Equipment Recommendations  BSC/3in1    Recommendations for Other Services       Precautions / Restrictions Precautions Precautions: Knee Precaution Booklet Issued: No Precaution Comments: Verbally reviewed knee precautions. Restrictions Weight Bearing Restrictions: Yes LLE Weight Bearing: Weight bearing as tolerated     Mobility  Bed Mobility Overal bed mobility: Needs Assistance Bed Mobility: Supine to Sit     Supine to sit: Modified independent (Device/Increase time)      General bed mobility comments: Understands how to use gait belt to assist her LE.    Transfers Overall transfer level: Needs assistance Equipment used: Rolling walker (2 wheels) Transfers: Sit to/from Stand, Bed to chair/wheelchair/BSC Sit to Stand: Modified independent (Device/Increase time)           General transfer comment: Pt doesnt need assist or cues to come to standing.    Ambulation/Gait Ambulation/Gait assistance: Modified independent (Device/Increase time) Gait Distance (Feet): 200 Feet Assistive device: Rolling walker (2 wheels) Gait Pattern/deviations: Step-to pattern, Decreased weight shift to left, Decreased stance time - left, Antalgic   Gait velocity interpretation: 1.31 - 2.62 ft/sec, indicative of limited community ambulator   General Gait Details: Pt able to use good technique with RW and is safe in controlled environment. No LOB with min challenges.   Stairs Stairs: Yes Stairs assistance: Modified independent (Device/Increase time) Stair Management: One rail Left, Step to pattern, Forwards, Backwards, With walker, Sideways Number of Stairs: 10 General stair comments: Pt taught to go up and down stairs 3 options.  Pt able to complete 1. with handrail sideways; 2. with walker and handrail; 3. backwards with walker.  Pt feels confident with all 3 ways and gave handouts for pt as well.   Wheelchair Mobility    Modified Rankin (Stroke Patients Only)       Balance Overall balance assessment: Needs assistance Sitting-balance support: No upper extremity supported, Feet supported Sitting balance-Leahy Scale: Good     Standing balance support: Bilateral upper extremity supported Standing balance-Leahy Scale: Poor Standing balance comment: Reliant on BUE support  Cognition Arousal/Alertness: Awake/alert Behavior During Therapy: WFL for tasks assessed/performed Overall Cognitive Status: Within Functional Limits  for tasks assessed                                          Exercises Total Joint Exercises Ankle Circles/Pumps: AROM, Both, 10 reps, Supine Quad Sets: AROM, Both, 10 reps, Supine Towel Squeeze: AROM, Both, 10 reps, Supine Heel Slides: AAROM, 10 reps, Supine, Left Hip ABduction/ADduction: AAROM, Left, 10 reps, Supine Straight Leg Raises: AAROM, Left, 10 reps, Supine Long Arc Quad: AROM, Left, 10 reps, Seated Knee Flexion: AAROM, Left, 10 reps, Seated Goniometric ROM: 0-92 degrees    General Comments General comments (skin integrity, edema, etc.): Demonstrated to pt how to get in and out of tub with tub bench using gait belt to assist LE into tub.      Pertinent Vitals/Pain Pain Assessment Pain Assessment: Faces Faces Pain Scale: Hurts little more Pain Location: L knee Pain Descriptors / Indicators: Guarding, Grimacing Pain Intervention(s): Limited activity within patient's tolerance, Monitored during session, Repositioned, Ice applied    Home Living                          Prior Function            PT Goals (current goals can now be found in the care plan section) Acute Rehab PT Goals Patient Stated Goal: to go home Progress towards PT goals: Progressing toward goals    Frequency    7X/week      PT Plan Current plan remains appropriate    Co-evaluation              AM-PAC PT "6 Clicks" Mobility   Outcome Measure  Help needed turning from your back to your side while in a flat bed without using bedrails?: None Help needed moving from lying on your back to sitting on the side of a flat bed without using bedrails?: None Help needed moving to and from a bed to a chair (including a wheelchair)?: None Help needed standing up from a chair using your arms (e.g., wheelchair or bedside chair)?: None Help needed to walk in hospital room?: None Help needed climbing 3-5 steps with a railing? : A Little 6 Click Score: 23    End of  Session Equipment Utilized During Treatment: Gait belt Activity Tolerance: Patient tolerated treatment well Patient left: in chair;with call bell/phone within reach Nurse Communication: Mobility status PT Visit Diagnosis: Other abnormalities of gait and mobility (R26.89);Difficulty in walking, not elsewhere classified (R26.2);Pain Pain - Right/Left: Left Pain - part of body: Knee     Time: 1003-1046 PT Time Calculation (min) (ACUTE ONLY): 43 min  Charges:  $Gait Training: 8-22 mins $Therapeutic Exercise: 8-22 mins $Therapeutic Activity: 8-22 mins                     [facility] M,PT Acute Rehab Services [phone]    Alvira Philips 05/22/2022, 11:04 AM

## 2022-05-22 NOTE — Progress Notes (Signed)
  Subjective: Vicki Frazier is a 61 y.o. female s/p left TKA.  They are POD 1.  Pt's pain is controlled.  Pt denies numbness/tingling/weakness.  Pt has ambulated with some difficulty to the bathroom and back.  No lightheadedness, dizziness, chest pain, shortness of breath, abdominal pain..     Objective: Vital signs in last 24 hours: Temp:  [97 F (36.1 C)-98.4 F (36.9 C)] 97.5 F (36.4 C) (07/14 0733) Pulse Rate:  [48-62] 52 (07/14 0733) Resp:  [13-21] 18 (07/14 0733) BP: (93-113)/(56-79) 113/56 (07/14 0733) SpO2:  [95 %-100 %] 100 % (07/14 0733)  Intake/Output from previous day: 07/13 0701 - 07/14 0700 In: 1764.9 [P.O.:120; I.V.:1444.9; IV Piggyback:200] Out: 1150 [Urine:1100; Blood:50] Intake/Output this shift: No intake/output data recorded.  Exam:  No gross blood or drainage overlying the dressing 2+ DP pulse Sensation intact distally in the left foot Able to dorsiflex and plantarflex the left foot No calf tenderness.  Negative Homans' sign. Able to perform straight leg raise without extensor lag   Labs: Recent Labs    05/22/22 0725  HGB 12.9   Recent Labs    05/22/22 0725  WBC 15.7*  RBC 4.28  HCT 39.7  PLT 246   No results for input(s): "NA", "K", "CL", "CO2", "BUN", "CREATININE", "GLUCOSE", "CALCIUM" in the last 72 hours. No results for input(s): "LABPT", "INR" in the last 72 hours.  Assessment/Plan: Pt is POD 1 s/p left TKA.    -Plan to discharge to home today or tomorrow pending patient's pain and PT eval.  She is staying with her sister and brother-in-law who will be helping her following surgery.  -WBAT with a walker    Donella Stade 05/22/2022, 8:33 AM

## 2022-05-24 ENCOUNTER — Encounter (HOSPITAL_COMMUNITY): Payer: Self-pay | Admitting: Orthopedic Surgery

## 2022-05-25 ENCOUNTER — Telehealth: Payer: Self-pay

## 2022-05-25 ENCOUNTER — Telehealth: Payer: Self-pay | Admitting: Orthopedic Surgery

## 2022-05-25 ENCOUNTER — Other Ambulatory Visit: Payer: Self-pay | Admitting: *Deleted

## 2022-05-25 ENCOUNTER — Other Ambulatory Visit: Payer: Self-pay | Admitting: Surgical

## 2022-05-25 DIAGNOSIS — Z96652 Presence of left artificial knee joint: Secondary | ICD-10-CM

## 2022-05-25 MED ORDER — HYDROCODONE-ACETAMINOPHEN 5-325 MG PO TABS: 1.0000 | ORAL_TABLET | Freq: Four times a day (QID) | ORAL | 0 refills | Status: DC | PRN

## 2022-05-25 NOTE — Telephone Encounter (Signed)
Patient called wanting to know if she could have another Rx sent to her pharmacy.  Stated that the Oxycodone is making her dizzy. Patient had left knee surgery on 05/21/2022.  CB# [phone].  Please advise.  Thank you.

## 2022-05-25 NOTE — Patient Outreach (Signed)
  Care Coordination [facility] Note Transition Care Management Follow-up Telephone Call Date of discharge and from where: 05/22/22 [facility] How have you been since you were released from the hospital? Patient states she has had some  pain but it is improving. Any questions or concerns? Yes, patient states she called Dr. Marlou Sa to ask about another medication for pain due to oxycodone is making her a little dizzy. Alvis Lemmings is waiting for approval from her insurance company to perform [facility] services.  Items Reviewed: Did the pt receive and understand the discharge instructions provided? Yes  Medications obtained and verified? Yes  Other? No  Any new allergies since your discharge? No  Dietary orders reviewed? Yes Do you have support at home? Yes   Home Care and Equipment/Supplies: Were home health services ordered? yes If so, what is the name of the agency? [facility]  Has the agency set up a time to come to the patient's home? No, they contact the patient and stated they are waiting for approval from her insurance company and they will call her back once they receive confirmation.  Were any new equipment or medical supplies ordered?  No What is the name of the medical supply agency? N/A Were you able to get the supplies/equipment? not applicable Do you have any questions related to the use of the equipment or supplies? No  Functional Questionnaire: (I = Independent and D = Dependent) ADLs: I  Bathing/Dressing- I  Meal Prep- I  Eating- I  Maintaining continence- I  Transferring/Ambulation- I  Managing Meds- I  Follow up appointments reviewed:  PCP Hospital f/u appt confirmed? No   Specialist Hospital f/u appt confirmed? Yes  Scheduled to see Dr. Marlou Sa on 06/05/22 @ 1045. Are transportation arrangements needed? No  If their condition worsens, is the pt aware to call PCP or go to the Emergency Dept.? Yes Was the patient provided with contact information for the PCP's office or ED? Yes Was to  pt encouraged to call back with questions or concerns? Yes  SDOH assessments and interventions completed:   Yes  Care Coordination Interventions Activated:  No Care Coordination Interventions:   N/A  Encounter Outcome:  Pt. Visit Completed  Emelia Loron RN, BSN [place] [phone] [email or web address]

## 2022-05-25 NOTE — Telephone Encounter (Signed)
Patient called asked if she can be set up for 2 wks of HHPT. Patient want to use [facility]   The Fax  number is [phone]    Please see previous note:  The phone number to contact patient is [phone]

## 2022-05-25 NOTE — Telephone Encounter (Signed)
I sent in RX for Hydrocodone.  Take this instead of oxycodone.  Do not take medications together. Let us know if symptoms persist with this medication

## 2022-05-26 NOTE — Telephone Encounter (Signed)
I called talked with patient. Vicki Frazier Caller with insurance. Latricia Heft is in network. I have sent Penni Homans, RN with [phone] message about getting patient in for HHPT

## 2022-05-26 NOTE — Telephone Encounter (Signed)
Notified patient.

## 2022-05-27 NOTE — Telephone Encounter (Signed)
Order sent to [place] with [facility] and she is working on this for patient.

## 2022-05-28 ENCOUNTER — Telehealth: Payer: Self-pay | Admitting: Orthopedic Surgery

## 2022-05-28 NOTE — Telephone Encounter (Signed)
Patient called stating that she recently had surgery on her knee last week and is having a lot of bruising and is wondering if that's normal. CB # [phone]

## 2022-05-29 NOTE — Telephone Encounter (Signed)
Tried calling her to discuss LMVM with details on VM. Placed order for outpatient PT

## 2022-05-29 NOTE — Telephone Encounter (Signed)
IC LMVM advising per Dr Marlou Sa bruising is normal and to be expected.

## 2022-06-01 ENCOUNTER — Encounter: Payer: Self-pay | Admitting: Physical Therapy

## 2022-06-01 ENCOUNTER — Ambulatory Visit (INDEPENDENT_AMBULATORY_CARE_PROVIDER_SITE_OTHER): Payer: PRIVATE HEALTH INSURANCE | Admitting: Physical Therapy

## 2022-06-01 ENCOUNTER — Other Ambulatory Visit: Payer: Self-pay

## 2022-06-01 DIAGNOSIS — R2681 Unsteadiness on feet: Secondary | ICD-10-CM

## 2022-06-01 DIAGNOSIS — M25662 Stiffness of left knee, not elsewhere classified: Secondary | ICD-10-CM

## 2022-06-01 DIAGNOSIS — M6281 Muscle weakness (generalized): Secondary | ICD-10-CM | POA: Diagnosis not present

## 2022-06-01 DIAGNOSIS — M25562 Pain in left knee: Secondary | ICD-10-CM | POA: Diagnosis not present

## 2022-06-01 DIAGNOSIS — R600 Localized edema: Secondary | ICD-10-CM

## 2022-06-01 NOTE — Therapy (Signed)
OUTPATIENT PHYSICAL THERAPY LOWER EXTREMITY EVALUATION   Patient Name: Vicki Frazier MRN: [id number] DOB:07-13-1961, 61 y.o., female Today's Date: 06/01/2022   PT End of Session - 06/01/22 1135     Visit Number 1    Number of Visits 16    Date for PT Re-Evaluation 07/27/22    PT Start Time 1100    PT Stop Time 1135    PT Time Calculation (min) 35 min    Activity Tolerance Patient tolerated treatment well    Behavior During Therapy [facility] for tasks assessed/performed             Past Medical History:  Diagnosis Date   Asthma    Hypertension    Past Surgical History:  Procedure Laterality Date   TOTAL KNEE ARTHROPLASTY Left 05/21/2022   Procedure: LEFT TOTAL KNEE ARTHROPLASTY;  Surgeon: Meredith Pel, MD;  Location: [facility];  Service: Orthopedics;  Laterality: Left;   Patient Active Problem List   Diagnosis Date Noted   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   S/P TKR (total knee replacement), left 05/21/2022   Erythema migrans (Lyme disease) 04/27/2022   Hx of healed stress fracture 01/20/2022   FHx: colon cancer 01/20/2022   Arthritis of knee, left 01/20/2022   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   Asthma in adult 11/30/2019   Class 3 severe obesity without serious comorbidity with body mass index (BMI) of 40.0 to 44.9 in adult ([place]) 11/30/2019    PCP: Flossie Buffy, NP  REFERRING PROVIDER: Meredith Pel, MD   REFERRING DIAG: [phone] (ICD-10-CM) - Status post total left knee replacement   THERAPY DIAG:  Acute pain of left knee - Plan: PT plan of care cert/re-cert  Stiffness of left knee, not elsewhere classified - Plan: PT plan of care cert/re-cert  Muscle weakness (generalized) - Plan: PT plan of care cert/re-cert  Unsteadiness on feet - Plan: PT plan of care cert/re-cert  Localized edema - Plan: PT plan of care cert/re-cert  Rationale for Evaluation and Treatment Rehabilitation  ONSET DATE:  05/21/22  SUBJECTIVE:   SUBJECTIVE STATEMENT: Pt is a 61 y/o female who presents to [place] s/p Lt TKA on 05/21/22.  She did not have HHPT but has CPM machine at home (up to 88 deg).    PERTINENT HISTORY: HTN and asthma   PAIN:  Are you having pain? Yes: NPRS scale: 2-3 currently, up to 8, at best 2/10 Pain location: Lt knee Pain description: aching  Aggravating factors: activity, bending knee Relieving factors: ice, lying down, medication  PRECAUTIONS: None  WEIGHT BEARING RESTRICTIONS No  FALLS:  Has patient fallen in last 6 months? Yes. 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, classes with adults with autism  PLOF: Independent and Leisure: gardening, walking, hiking, [facility] some [facility] [employer]   PATIENT GOALS improve mobility and be able to walk dog, regain use of LLE   OBJECTIVE:   PATIENT SURVEYS:  06/01/22: FOTO 30 (predicted 55)  COGNITION: Overall cognitive status: Within functional limits for tasks assessed     SENSATION: WFL  EDEMA:  06/01/22: Circumferential: joint line - with stocking Lt 49 cm, Rt 41 cm  POSTURE: rounded shoulders and forward head  LOWER EXTREMITY ROM:  Active ROM Right eval Left eval  Knee flexion WNL Supine: 82*  Knee extension Seated LAQ: 2* hyperext Seated LAQ: -26* Following quad sets: -21*   (Blank rows = not tested)   Passive ROM Right eval Left eval  Knee flexion  Supine: 92* end-feel limited by pain  Knee extension  Supine: 0*   (Blank rows = not tested)  LOWER EXTREMITY MMT:  MMT Right eval Left eval  Knee flexion  3-/5  Knee extension  3-/5   (Blank rows = not tested)   GAIT: 06/01/22: Distance walked: 100' Assistive  device utilized: Environmental consultant - 2 wheeled Level of assistance: Modified independence Comments: decreased stance on LLE, decreased hip/knee flexion on Lt, mild antalgic gait    TODAY'S TREATMENT: 06/01/22 See HEP - performed trial reps PRN for comprehension and mod cues for technique   PATIENT EDUCATION:  Education details: [place] Person educated: Patient Education method: Explanation, Demonstration, Tactile cues, Verbal cues, and Handouts Education comprehension: verbalized understanding, returned demonstration, verbal cues required, and tactile cues required   HOME EXERCISE PROGRAM: Access Code: [email or web address] URL: [email or web address] Date: 06/01/2022 Prepared by: Faustino Congress  Exercises - Supine Heel Slide with Strap  - 5-10 x daily - 7 x weekly - 2-3 sets - 10 reps - Long Sitting Quad Set  - 5-10 x daily - 7 x weekly - 2-3 sets - 10 reps - 5 sec hold - Seated Long Arc Quad  - 5-10 x daily - 7 x weekly - 2-3 sets - 10 reps - 5 sec hold - Seated Knee Flexion AAROM  - 5-10 x daily - 7 x weekly - 2-3 sets - 10 reps  ASSESSMENT:  CLINICAL IMPRESSION: Patient is a 61 y.o. female who was seen today for physical therapy evaluation and treatment for Lt TKA on 05/21/22.  She demonstrates decreased ROM and strength, gait abnormalities and decreased balance with expected post op pain and swelling affecting functional mobility.  She will benefit from PT to address deficits listed.     OBJECTIVE IMPAIRMENTS Abnormal gait, decreased activity tolerance, decreased balance, decreased knowledge of use of DME, decreased mobility, difficulty walking, decreased ROM, decreased strength, increased edema, and pain.   ACTIVITY LIMITATIONS carrying, lifting, bending, sitting, standing, squatting, sleeping, stairs, transfers, bed mobility, and locomotion level  PARTICIPATION LIMITATIONS: meal prep, cleaning, laundry, driving, community activity, occupation, and yard work  PERSONAL FACTORS  1-2 comorbidities: HTN and asthma   are also affecting patient's functional outcome.   REHAB POTENTIAL: Good  CLINICAL DECISION MAKING: Evolving/moderate complexity  EVALUATION COMPLEXITY: Moderate   GOALS: Goals reviewed with patient? Yes  SHORT TERM GOALS: Target date: 06/29/2022  Independent with initial HEP Goal status: INITIAL  2.  Lt knee AROM improved to 0-10-95 deg for improved mobility Goal status: INITIAL   LONG TERM GOALS: Target date: 07/27/2022  Independent with final HEP Goal status: INITIAL  2.  FOTO score improved to 55 Goal status: INITIAL  3.  Lt knee AROM improved to 0-110 for improved mobility Goal status: INIITAL  4.  Report pain < 4/10 with standing and walking activities for improved function, and in preparation for return to work Goal status: INITIAL  5.  Amb without AD without significant deviations independently for improved functional mobility Goal status: INITIAL    PLAN: PT FREQUENCY: 2x/week  PT DURATION: 8 weeks  PLANNED INTERVENTIONS: Therapeutic exercises, Therapeutic activity, Neuromuscular re-education, Balance training, Gait training, Patient/Family education, Self Care, Joint mobilization, Aquatic Therapy, Dry Needling,  Electrical stimulation, Cryotherapy, Moist heat, scar mobilization, Taping, Vasopneumatic device, Manual therapy, and Re-evaluation  PLAN FOR NEXT SESSION: quad activation, flexion focus for ROM, gait with cane   Laureen Abrahams, PT, DPT 06/01/22 11:50 AM

## 2022-06-05 ENCOUNTER — Encounter: Payer: Self-pay | Admitting: Physical Therapy

## 2022-06-05 ENCOUNTER — Ambulatory Visit (INDEPENDENT_AMBULATORY_CARE_PROVIDER_SITE_OTHER): Payer: PRIVATE HEALTH INSURANCE

## 2022-06-05 ENCOUNTER — Encounter: Payer: Self-pay | Admitting: Orthopedic Surgery

## 2022-06-05 ENCOUNTER — Ambulatory Visit (INDEPENDENT_AMBULATORY_CARE_PROVIDER_SITE_OTHER): Payer: PRIVATE HEALTH INSURANCE | Admitting: Orthopedic Surgery

## 2022-06-05 ENCOUNTER — Ambulatory Visit (INDEPENDENT_AMBULATORY_CARE_PROVIDER_SITE_OTHER): Payer: PRIVATE HEALTH INSURANCE | Admitting: Physical Therapy

## 2022-06-05 DIAGNOSIS — Z96652 Presence of left artificial knee joint: Secondary | ICD-10-CM

## 2022-06-05 DIAGNOSIS — M25562 Pain in left knee: Secondary | ICD-10-CM

## 2022-06-05 DIAGNOSIS — M25662 Stiffness of left knee, not elsewhere classified: Secondary | ICD-10-CM

## 2022-06-05 DIAGNOSIS — M6281 Muscle weakness (generalized): Secondary | ICD-10-CM

## 2022-06-05 DIAGNOSIS — R600 Localized edema: Secondary | ICD-10-CM

## 2022-06-05 DIAGNOSIS — R2681 Unsteadiness on feet: Secondary | ICD-10-CM | POA: Diagnosis not present

## 2022-06-05 MED ORDER — METHOCARBAMOL 500 MG PO TABS: 500.0000 mg | ORAL_TABLET | Freq: Four times a day (QID) | ORAL | 0 refills | Status: DC

## 2022-06-05 MED ORDER — HYDROCODONE-ACETAMINOPHEN 5-325 MG PO TABS: 1.0000 | ORAL_TABLET | Freq: Four times a day (QID) | ORAL | 0 refills | Status: DC | PRN

## 2022-06-05 NOTE — Therapy (Signed)
OUTPATIENT PHYSICAL THERAPY LOWER EXTREMITY TREATMENT   Patient Name: Vicki Frazier MRN: [id number] DOB:Nov 03, 1961, 61 y.o., female Today's Date: 06/05/2022   PT End of Session - 06/05/22 0938     Visit Number 2    Number of Visits 16    Date for PT Re-Evaluation 07/27/22    PT Start Time 0931    PT Stop Time 1011    PT Time Calculation (min) 40 min    Activity Tolerance Patient tolerated treatment well    Behavior During Therapy [facility] for tasks assessed/performed              Past Medical History:  Diagnosis Date   Asthma    Hypertension    Past Surgical History:  Procedure Laterality Date   TOTAL KNEE ARTHROPLASTY Left 05/21/2022   Procedure: LEFT TOTAL KNEE ARTHROPLASTY;  Surgeon: Vicki Pel, MD;  Location: [place];  Service: Orthopedics;  Laterality: Left;   Patient Active Problem List   Diagnosis Date Noted   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   S/P TKR (total knee replacement), left 05/21/2022   Erythema migrans (Lyme disease) 04/27/2022   Hx of healed stress fracture 01/20/2022   FHx: colon cancer 01/20/2022   Arthritis of knee, left 01/20/2022   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   Asthma in adult 11/30/2019   Class 3 severe obesity without serious comorbidity with body mass index (BMI) of 40.0 to 44.9 in adult [facility]) 11/30/2019    PCP: Vicki Buffy, NP  REFERRING PROVIDER: Meredith Pel, MD   REFERRING DIAG: [phone] (ICD-10-CM) - Status post total left knee replacement   THERAPY DIAG:  Acute pain of left knee  Stiffness of left knee, not elsewhere classified  Muscle weakness (generalized)  Unsteadiness on feet  Localized edema  Rationale for Evaluation and Treatment Rehabilitation  ONSET DATE: 05/21/22  SUBJECTIVE:   SUBJECTIVE STATEMENT:  I'm doing OK, I'm above ground. I tend to be tight in the morning, then do OK once I loosen up for awhile, then at the end of the day I'm tight and  tired again. I switched to using a cane yesterday, I was using the RW. When I get tired my knee buckles. I tend to have more pain at night and my sleep gets disrupted.   PERTINENT HISTORY: HTN and asthma   PAIN:  Are you having pain? Yes: NPRS scale: 3 currently, up to 8, at best 2/10 Pain location: Lt knee Pain description: aching  Aggravating factors: activity, bending knee Relieving factors: ice, lying down, medication  PRECAUTIONS: None  WEIGHT BEARING RESTRICTIONS No  FALLS:  Has patient fallen in last 6 months? Yes. 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, classes with adults with autism  PLOF: Independent and Leisure: gardening, walking, hiking, [facility] [employer]   PATIENT GOALS improve mobility and be able to walk dog, regain use of LLE   OBJECTIVE:   PATIENT SURVEYS:  06/01/22: FOTO 30 (predicted 55)  COGNITION: Overall cognitive status: Within functional limits for tasks assessed     SENSATION: WFL  EDEMA:  06/01/22: Circumferential: joint line - with stocking Lt 49 cm, Rt 41 cm  POSTURE: rounded shoulders and  forward head  LOWER EXTREMITY ROM:  Active ROM Right eval Left eval  Knee flexion WNL Supine: 82*  Knee extension Seated LAQ: 2* hyperext Seated LAQ: -26* Following quad sets: -21*   (Blank rows = not tested)   Passive ROM Right eval Left eval  Knee flexion  Supine: 92* end-feel limited by pain  Knee extension  Supine: 0*   (Blank rows = not tested)  LOWER EXTREMITY MMT:  MMT Right eval Left eval  Knee flexion  3-/5  Knee extension  3-/5   (Blank rows = not tested)   GAIT: 06/01/22: Distance walked: 100' Assistive device utilized:  Environmental consultant - 2 wheeled Level of assistance: Modified independence Comments: decreased stance on LLE, decreased hip/knee flexion on Lt, mild antalgic gait    TODAY'S TREATMENT:  06/05/22  Precor reclining bike x6 minutes partial rotations at seat 6  Super set: quad sets 2x15 5 second holds + SAQs  2x10 (mod tactile facilitation for quad activation >> SLR or glute set) Vicki Frazier x15  Retrograde massage for edema control LEs elevated  Knee flexion stretches and overpressure to tolerance   Education throughout session (see below for details)   06/01/22 See HEP - performed trial reps PRN for comprehension and mod cues for technique   PATIENT EDUCATION:  Education details: Dent Person educated: Patient Education method: Explanation, Demonstration, Tactile cues, Verbal cues, and Handouts Education comprehension: verbalized understanding, returned demonstration, verbal cues required, and tactile cues required   HOME EXERCISE PROGRAM: Access Code: [email or web address] URL: [email or web address] Date: 06/01/2022 Prepared by: Vicki Frazier  Exercises - Supine Heel Slide with Strap  - 5-10 x daily - 7 x weekly - 2-3 sets - 10 reps - Long Sitting Quad Set  - 5-10 x daily - 7 x weekly - 2-3 sets - 10 reps - 5 sec hold - Seated Long Arc Quad  - 5-10 x daily - 7 x weekly - 2-3 sets - 10 reps - 5 sec hold - Seated Knee Flexion AAROM  - 5-10 x daily - 7 x weekly - 2-3 sets - 10 reps  ASSESSMENT:  CLINICAL IMPRESSION:  Vicki Frazier arrives today doing OK, having ongoing issues with pain at night and not sleeping well. Has been using cane more than RW recently but tells me her knee does buckle when using it sometimes, advised her to use RW when fatigued for safety/fall prevention. Education provided on edema control as well as graded activity as she continues to recover Worked on some ROM with partial rotations on Precor bike, then we focused on basic exercises for quad activation as well as  interventions for ROM and edema control this morning. Tends to compensate for quad weakness with glute activation. Will progress as able and tolerated.    OBJECTIVE IMPAIRMENTS Abnormal gait, decreased activity tolerance, decreased balance, decreased knowledge of use of DME, decreased mobility, difficulty walking, decreased ROM, decreased strength, increased edema, and pain.   ACTIVITY LIMITATIONS carrying, lifting, bending, sitting, standing, squatting, sleeping, stairs, transfers, bed mobility, and locomotion level  PARTICIPATION LIMITATIONS: meal prep, cleaning, laundry, driving, community activity, occupation, and yard work  PERSONAL FACTORS 1-2 comorbidities: HTN and asthma   are also affecting patient's functional outcome.   REHAB POTENTIAL: Good  CLINICAL DECISION MAKING: Evolving/moderate complexity  EVALUATION COMPLEXITY: Moderate   GOALS: Goals reviewed with patient? Yes  SHORT TERM GOALS: Target date: 06/29/2022  Independent with initial HEP Goal status: INITIAL  2.  Lt knee AROM improved to 0-10-95 deg for improved mobility Goal  status: INITIAL   LONG TERM GOALS: Target date: 07/27/2022  Independent with final HEP Goal status: INITIAL  2.  FOTO score improved to 55 Goal status: INITIAL  3.  Lt knee AROM improved to 0-110 for improved mobility Goal status: INIITAL  4.  Report pain < 4/10 with standing and walking activities for improved function, and in preparation for return to work Goal status: INITIAL  5.  Amb without AD without significant deviations independently for improved functional mobility Goal status: INITIAL    PLAN: PT FREQUENCY: 2x/week  PT DURATION: 8 weeks  PLANNED INTERVENTIONS: Therapeutic exercises, Therapeutic activity, Neuromuscular re-education, Balance training, Gait training, Patient/Family education, Self Care, Joint mobilization, Aquatic Therapy, Dry Needling, Electrical stimulation, Cryotherapy, Moist heat, scar mobilization,  Taping, Vasopneumatic device, Manual therapy, and Re-evaluation  PLAN FOR NEXT SESSION: quad activation, flexion focus for ROM, gait with cane   Roc Streett U PT DPT PN2  06/05/2022, 10:12 AM

## 2022-06-05 NOTE — Progress Notes (Signed)
   Post-Op Visit Note   Patient: Vicki Frazier           Date of Birth: 1961-01-24           MRN: [id number] Visit Date: 06/05/2022 PCP: Flossie Buffy, NP   Assessment & Plan:  Chief Complaint:  Chief Complaint  Patient presents with   Left Knee - Routine Post Op   Visit Diagnoses:  1. Status post left knee replacement     Plan: Vicki Frazier is a 61 year old patient who is now 2 weeks out left total knee replacement.  She started outpatient physical therapy.  No calf tenderness on examination today.  Range of motion is about 0-90.  Very good stability to varus and valgus stress.  Plan at this time is refill Norco and Robaxin.  Continue with outpatient physical therapy.  4-week return for clinical recheck.  Follow-Up Instructions: No follow-ups on file.   Orders:  Orders Placed This Encounter  Procedures   XR Knee 1-2 Views Left   Meds ordered this encounter  Medications   methocarbamol (ROBAXIN) 500 MG tablet    Sig: Take 1 tablet (500 mg total) by mouth 4 (four) times daily.    Dispense:  30 tablet    Refill:  0   HYDROcodone-acetaminophen (NORCO/VICODIN) 5-325 MG tablet    Sig: Take 1 tablet by mouth every 6 (six) hours as needed for moderate pain.    Dispense:  30 tablet    Refill:  0    Imaging: No results found.  PMFS History: Patient Active Problem List   Diagnosis Date Noted   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   S/P TKR (total knee replacement), left 05/21/2022   Erythema migrans (Lyme disease) 04/27/2022   Hx of healed stress fracture 01/20/2022   FHx: colon cancer 01/20/2022   Arthritis of knee, left 01/20/2022   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   Asthma in adult 11/30/2019   Class 3 severe obesity without serious comorbidity with body mass index (BMI) of 40.0 to 44.9 in adult [facility]) 11/30/2019   Past Medical History:  Diagnosis Date   Asthma    Hypertension     Family History  Problem Relation Age of Onset    Cancer Father 18       bladder   Cancer Sister 29       melanoma   Obesity Sister    Heart disease Sister    Cancer Maternal Grandmother 80       colon cancer    Past Surgical History:  Procedure Laterality Date   TOTAL KNEE ARTHROPLASTY Left 05/21/2022   Procedure: LEFT TOTAL KNEE ARTHROPLASTY;  Surgeon: Meredith Pel, MD;  Location: [place];  Service: Orthopedics;  Laterality: Left;   Social History   Occupational History   Not on file  Tobacco Use   Smoking status: Never   Smokeless tobacco: Never  Vaping Use   Vaping Use: Never used  Substance and Sexual Activity   Alcohol use: Never   Drug use: Never   Sexual activity: Not on file

## 2022-06-08 ENCOUNTER — Encounter: Payer: Self-pay | Admitting: Physical Therapy

## 2022-06-08 ENCOUNTER — Ambulatory Visit (INDEPENDENT_AMBULATORY_CARE_PROVIDER_SITE_OTHER): Payer: PRIVATE HEALTH INSURANCE | Admitting: Physical Therapy

## 2022-06-08 DIAGNOSIS — M25562 Pain in left knee: Secondary | ICD-10-CM | POA: Diagnosis not present

## 2022-06-08 DIAGNOSIS — R600 Localized edema: Secondary | ICD-10-CM

## 2022-06-08 DIAGNOSIS — R2681 Unsteadiness on feet: Secondary | ICD-10-CM | POA: Diagnosis not present

## 2022-06-08 DIAGNOSIS — M25662 Stiffness of left knee, not elsewhere classified: Secondary | ICD-10-CM | POA: Diagnosis not present

## 2022-06-08 DIAGNOSIS — M6281 Muscle weakness (generalized): Secondary | ICD-10-CM

## 2022-06-08 NOTE — Therapy (Signed)
OUTPATIENT PHYSICAL THERAPY LOWER EXTREMITY TREATMENT   Patient Name: Vicki Frazier MRN: [id number] DOB:September 04, 1961, 61 y.o., female Today's Date: 06/08/2022   PT End of Session - 06/08/22 1112     Visit Number 3    Number of Visits 16    Date for PT Re-Evaluation 07/27/22    PT Start Time 1016    PT Stop Time 1108    PT Time Calculation (min) 52 min    Activity Tolerance Patient tolerated treatment well    Behavior During Therapy [facility] for tasks assessed/performed               Past Medical History:  Diagnosis Date   Asthma    Hypertension    Past Surgical History:  Procedure Laterality Date   TOTAL KNEE ARTHROPLASTY Left 05/21/2022   Procedure: LEFT TOTAL KNEE ARTHROPLASTY;  Surgeon: Meredith Pel, MD;  Location: [place];  Service: Orthopedics;  Laterality: Left;   Patient Active Problem List   Diagnosis Date Noted   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   S/P TKR (total knee replacement), left 05/21/2022   Erythema migrans (Lyme disease) 04/27/2022   Hx of healed stress fracture 01/20/2022   FHx: colon cancer 01/20/2022   Arthritis of knee, left 01/20/2022   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   Asthma in adult 11/30/2019   Class 3 severe obesity without serious comorbidity with body mass index (BMI) of 40.0 to 44.9 in adult [facility]) 11/30/2019    PCP: Flossie Buffy, NP  REFERRING PROVIDER: Meredith Pel, MD   REFERRING DIAG: [phone] (ICD-10-CM) - Status post total left knee replacement   THERAPY DIAG:  Acute pain of left knee  Stiffness of left knee, not elsewhere classified  Muscle weakness (generalized)  Unsteadiness on feet  Localized edema  Rationale for Evaluation and Treatment Rehabilitation  ONSET DATE: 05/21/22  SUBJECTIVE:   SUBJECTIVE STATEMENT: Doing CPM up to 110 degrees.  MD pleased with progress.    PERTINENT HISTORY: HTN and asthma   PAIN:  Are you having pain? Yes: NPRS scale:  3-4 currently, up to 5, at best 2/10 Pain location: Lt knee Pain description: aching  Aggravating factors: activity, bending knee Relieving factors: ice, lying down, medication  PRECAUTIONS: None  WEIGHT BEARING RESTRICTIONS No  PATIENT GOALS improve mobility and be able to walk dog, regain use of LLE   OBJECTIVE:   PATIENT SURVEYS:  06/01/22: FOTO 30 (predicted 55)  EDEMA:  06/01/22: Circumferential: joint line - with stocking Lt 49 cm, Rt 41 cm  POSTURE: rounded shoulders and forward head  LOWER EXTREMITY ROM:  Active ROM Right eval Left eval Left 06/08/22  Knee flexion WNL Supine: 82* Supine: 103  Knee extension Seated LAQ: 2* hyperext Seated LAQ: -26* Following quad sets: -21* Seated LAQ: -10   (Blank rows = not tested)   Passive ROM Right eval Left eval Left 06/08/22  Knee flexion  Supine: 92* end-feel limited by pain Supine: 107  Knee extension  Supine: 0*    (Blank rows = not tested)  LOWER EXTREMITY MMT:  MMT Right eval Left eval  Knee flexion  3-/5  Knee extension  3-/5   (Blank rows = not tested)   GAIT: 06/01/22: Distance walked: 100' Assistive device utilized: Environmental consultant - 2 wheeled Level of assistance: Modified independence Comments: decreased stance on LLE, decreased hip/knee flexion on Lt, mild antalgic gait    TODAY'S TREATMENT: 06/08/22 Therex: NuStep L6 x 8 min Leg Press  bil 75# x 10; 100# 2x10; LLE only 37# 2x10 LAQ with 4# on Lt 2x10 Long sitting quad sets x 20 reps AA heel slides x 10 reps on Lt SAQ 2# on Lt 2x10  Modalities: Vaso x 10 min; Lt knee mod pressure, 34 deg   06/05/22  Precor reclining bike x6 minutes partial rotations at seat 6  Super set: quad sets 2x15 5 second holds + SAQs  2x10 (mod tactile facilitation for quad activation >> SLR or glute set) Henreitta Leber x15  Retrograde massage for edema control LEs elevated  Knee flexion stretches and overpressure to tolerance   Education throughout session (see below for  details)   06/01/22 See HEP - performed trial reps PRN for comprehension and mod cues for technique   PATIENT EDUCATION:  Education details: [email or web address] Person educated: Patient Education method: Explanation, Demonstration, Tactile cues, Verbal cues, and Handouts Education comprehension: verbalized understanding, returned demonstration, verbal cues required, and tactile cues required   HOME EXERCISE PROGRAM: Access Code: [email or web address] URL: [email or web address] Date: 06/01/2022 Prepared by: Moshe Cipro  Exercises - Supine Heel Slide with Strap  - 5-10 x daily - 7 x weekly - 2-3 sets - 10 reps - Long Sitting Quad Set  - 5-10 x daily - 7 x weekly - 2-3 sets - 10 reps - 5 sec hold - Seated Long Arc Quad  - 5-10 x daily - 7 x weekly - 2-3 sets - 10 reps - 5 sec hold - Seated Knee Flexion AAROM  - 5-10 x daily - 7 x weekly - 2-3 sets - 10 reps  ASSESSMENT:  CLINICAL IMPRESSION: Significant improvement in AROM noted today and overall improved functional mobility and amb with SPC today. She continues to progress well with PT and will continue to benefit from PT to maximize function.  STGs #1 and #2 met today.  OBJECTIVE IMPAIRMENTS Abnormal gait, decreased activity tolerance, decreased balance, decreased knowledge of use of DME, decreased mobility, difficulty walking, decreased ROM, decreased strength, increased edema, and pain.   ACTIVITY LIMITATIONS carrying, lifting, bending, sitting, standing, squatting, sleeping, stairs, transfers, bed mobility, and locomotion level  PARTICIPATION LIMITATIONS: meal prep, cleaning, laundry, driving, community activity, occupation, and yard work  PERSONAL FACTORS 1-2 comorbidities: HTN and asthma   are also affecting patient's functional outcome.   REHAB POTENTIAL: Good  CLINICAL DECISION MAKING: Evolving/moderate complexity  EVALUATION COMPLEXITY: Moderate   GOALS: Goals reviewed with patient? Yes  SHORT TERM GOALS: Target  date: 06/29/2022  Independent with initial HEP Goal status: MET 06/08/22  2.  Lt knee AROM improved to 0-10-95 deg for improved mobility Goal status: MET 06/08/22   LONG TERM GOALS: Target date: 07/27/2022  Independent with final HEP Goal status: INITIAL  2.  FOTO score improved to 55 Goal status: INITIAL  3.  Lt knee AROM improved to 0-110 for improved mobility Goal status: INIITAL  4.  Report pain < 4/10 with standing and walking activities for improved function, and in preparation for return to work Goal status: INITIAL  5.  Amb without AD without significant deviations independently for improved functional mobility Goal status: INITIAL    PLAN: PT FREQUENCY: 2x/week  PT DURATION: 8 weeks  PLANNED INTERVENTIONS: Therapeutic exercises, Therapeutic activity, Neuromuscular re-education, Balance training, Gait training, Patient/Family education, Self Care, Joint mobilization, Aquatic Therapy, Dry Needling, Electrical stimulation, Cryotherapy, Moist heat, scar mobilization, Taping, Vasopneumatic device, Manual therapy, and Re-evaluation  PLAN FOR NEXT SESSION: quad activation, flexion focus for ROM, standing balance/strengthening exercises, vaso PRN  Laureen Abrahams, PT, DPT 06/08/22 11:14 AM

## 2022-06-10 NOTE — Discharge Summary (Signed)
Physician Discharge Summary      Patient ID: Vicki Frazier MRN: [id number] DOB/AGE: 1960-11-12 61 y.o.  Admit date: 05/21/2022 Discharge date: 05/22/2022  Admission Diagnoses:  Principal Problem:   DJD (degenerative joint disease) of knee Active Problems:   S/P TKR (total knee replacement), left   Discharge Diagnoses:  Same  Surgeries: Procedure(s): LEFT TOTAL KNEE ARTHROPLASTY on 05/21/2022   Consultants:   Discharged Condition: Stable  Hospital Course: Vicki Frazier is an 61 y.o. female who was admitted 05/21/2022 with a chief complaint of left knee pain, and found to have a diagnosis of left knee osteoarthritis.  They were brought to the operating room on 05/21/2022 and underwent the above named procedures.  Pt awoke from anesthesia without complication and was transferred to the floor. On POD1, Patient's pain was overall controlled.  She is able to ambulate well with physical therapy.  No red flag complaints.  She was discharged home on POD 1..  Pt will f/u with Dr. Marlou Sa in clinic in ~2 weeks.   Antibiotics given:  Anti-infectives (From admission, onward)    Start     Dose/Rate Route Frequency Ordered Stop   05/21/22 1600  ceFAZolin (ANCEF) IVPB 2g/100 mL premix        2 g 200 mL/hr over 30 Minutes Intravenous Every 8 hours 05/21/22 1311 05/21/22 2147   05/21/22 0841  vancomycin (VANCOCIN) powder  Status:  Discontinued          As needed 05/21/22 0841 05/21/22 1014   05/21/22 0600  ceFAZolin (ANCEF) IVPB 2g/100 mL premix        2 g 200 mL/hr over 30 Minutes Intravenous On call to O.R. 05/21/22 0559 05/21/22 0753     .  Recent vital signs:  Vitals:   05/22/22 0439 05/22/22 0733  BP: (!) 100/58 (!) 113/56  Pulse: 60 (!) 52  Resp: 17 18  Temp:  (!) 97.5 F (36.4 C)  SpO2: 98% 100%    Recent laboratory studies:  Results for orders placed or performed during the hospital encounter of 05/21/22  Urine Culture   Specimen: Urine, In & Out Cath  Result Value Ref  Range   Specimen Description IN/OUT CATH URINE    Special Requests URINE IN AND OUT CATH SPEC A    Culture      NO GROWTH Performed at [facility] [address], [place], [place] [address]    Report Status 05/22/2022 FINAL   Glucose, capillary  Result Value Ref Range   Glucose-Capillary 110 (H) 70 - 99 mg/dL  CBC  Result Value Ref Range   WBC 15.7 (H) 4.0 - 10.5 K/uL   RBC 4.28 3.87 - 5.11 MIL/uL   Hemoglobin 12.9 12.0 - 15.0 g/dL   HCT 39.7 36.0 - 46.0 %   MCV 92.8 80.0 - 100.0 fL   MCH 30.1 26.0 - 34.0 pg   MCHC 32.5 30.0 - 36.0 g/dL   RDW 12.9 11.5 - 15.5 %   Platelets 246 150 - 400 K/uL   nRBC 0.0 0.0 - 0.2 %    Discharge Medications:   Allergies as of 05/22/2022       Reactions   Molds & Smuts    Other reaction(s): Eye Redness   Grass Pollen(k-o-r-t-swt Vern) Other (See Comments)   Itchy eyes   Other Itching   Animal dander Runny nose itchy eyes   Peanut Allergen Powder-dnfp Itching   Runny nose itchy eyes  Medication List     STOP taking these medications    ibuprofen 200 MG tablet Commonly known as: ADVIL       TAKE these medications    albuterol 108 (90 Base) MCG/ACT inhaler Commonly known as: VENTOLIN HFA Inhale 2 puffs into the lungs every 6 (six) hours as needed for wheezing or shortness of breath. What changed: reasons to take this   Calcium 600+D 600-20 MG-MCG Tabs Generic drug: Calcium Carb-Cholecalciferol Take 1 tablet by mouth daily.   cetirizine 10 MG tablet Commonly known as: ZYRTEC Take 10 mg by mouth daily.   Culturelle Caps Take 1 capsule by mouth daily.   docusate sodium 100 MG capsule Commonly known as: COLACE Take 1 capsule (100 mg total) by mouth 2 (two) times daily.   doxycycline 100 MG tablet Commonly known as: VIBRA-TABS Take 1 tablet (100 mg total) by mouth 2 (two) times daily.   fluticasone 50 MCG/ACT nasal spray Commonly known as: FLONASE Place 1 spray into both nostrils daily.    hydrOXYzine 50 MG tablet Commonly known as: ATARAX Take 50 mg by mouth daily as needed for itching.   methocarbamol 500 MG tablet Commonly known as: ROBAXIN Take 1 tablet (500 mg total) by mouth every 8 (eight) hours as needed for muscle spasms.   Multi-Vitamin tablet Take 1 tablet by mouth daily. Woman   mupirocin ointment 2 % Commonly known as: BACTROBAN Apply 1 Application topically 3 (three) times daily.   rivaroxaban 10 MG Tabs tablet Commonly known as: XARELTO Take 1 tablet (10 mg total) by mouth daily.   vitamin D3 50 MCG (2000 UT) Caps Take 2,000 Units by mouth 2 (two) times daily.        Diagnostic Studies: XR Knee 1-2 Views Left  Result Date: 06/05/2022 AP lateral radiographs left knee reviewed.  Press-fit total knee prosthesis in good position alignment with no complicating features.  Alignment intact.   Disposition: Discharge disposition: 01-Home or Self Care       Discharge Instructions     Call MD / Call 911   Complete by: As directed    If you experience chest pain or shortness of breath, CALL 911 and be transported to the hospital emergency room.  If you develope a fever above 101 F, pus (white drainage) or increased drainage or redness at the wound, or calf pain, call your surgeon's office.   Constipation Prevention   Complete by: As directed    Drink plenty of fluids.  Prune juice may be helpful.  You may use a stool softener, such as Colace (over the counter) 100 mg twice a day.  Use MiraLax (over the counter) for constipation as needed.   Diet - low sodium heart healthy   Complete by: As directed    Discharge instructions   Complete by: As directed    You may shower, dressing is waterproof.  Do not remove the dressing, we will remove it at your first post-op appointment.  Do not take a bath or soak the knee in a tub or pool.  You may weightbear as you can tolerate on the operative leg with a walker.  Continue using the CPM machine 3 times per day  for one hour each time, increasing the degrees of range of motion daily.  Use the blue cradle boot under your heel to work on getting your leg straight.  Do NOT put a pillow under your knee.  You will follow-up with Dr. Marlou Sa in the clinic in  2 weeks at your given appointment date.    INSTRUCTIONS AFTER JOINT REPLACEMENT   Remove items at home which could result in a fall. This includes throw rugs or furniture in walking pathways ICE to the affected joint every three hours while awake for 30 minutes at a time, for at least the first 3-5 days, and then as needed for pain and swelling.  Continue to use ice for pain and swelling. You may notice swelling that will progress down to the foot and ankle.  This is normal after surgery.  Elevate your leg when you are not up walking on it.   Continue to use the breathing machine you got in the hospital (incentive spirometer) which will help keep your temperature down.  It is common for your temperature to cycle up and down following surgery, especially at night when you are not up moving around and exerting yourself.  The breathing machine keeps your lungs expanded and your temperature down.   DIET:  As you were doing prior to hospitalization, we recommend a well-balanced diet.  DRESSING / WOUND CARE / SHOWERING  Keep the surgical dressing until follow up.  The dressing is water proof, so you can shower without any extra covering.  IF THE DRESSING FALLS OFF or the wound gets wet inside, change the dressing with sterile gauze.  Please use good hand washing techniques before changing the dressing.  Do not use any lotions or creams on the incision until instructed by your surgeon.    ACTIVITY  Increase activity slowly as tolerated, but follow the weight bearing instructions below.   No driving for 6 weeks or until further direction given by your physician.  You cannot drive while taking narcotics.  No lifting or carrying greater than 10 lbs. until further  directed by your surgeon. Avoid periods of inactivity such as sitting longer than an hour when not asleep. This helps prevent blood clots.  You may return to work once you are authorized by your doctor.     WEIGHT BEARING   Weight bearing as tolerated with assist device (walker, cane, etc) as directed, use it as long as suggested by your surgeon or therapist, typically at least 4-6 weeks.   EXERCISES  Results after joint replacement surgery are often greatly improved when you follow the exercise, range of motion and muscle strengthening exercises prescribed by your doctor. Safety measures are also important to protect the joint from further injury. Any time any of these exercises cause you to have increased pain or swelling, decrease what you are doing until you are comfortable again and then slowly increase them. If you have problems or questions, call your caregiver or physical therapist for advice.   Rehabilitation is important following a joint replacement. After just a few days of immobilization, the muscles of the leg can become weakened and shrink (atrophy).  These exercises are designed to build up the tone and strength of the thigh and leg muscles and to improve motion. Often times heat used for twenty to thirty minutes before working out will loosen up your tissues and help with improving the range of motion but do not use heat for the first two weeks following surgery (sometimes heat can increase post-operative swelling).   These exercises can be done on a training (exercise) mat, on the floor, on a table or on a bed. Use whatever works the best and is most comfortable for you.    Use music or television while you are exercising  so that the exercises are a pleasant break in your day. This will make your life better with the exercises acting as a break in your routine that you can look forward to.   Perform all exercises about fifteen times, three times per day or as directed.  You should  exercise both the operative leg and the other leg as well.  Exercises include:   Quad Sets - Tighten up the muscle on the front of the thigh (Quad) and hold for 5-10 seconds.   Straight Leg Raises - With your knee straight (if you were given a brace, keep it on), lift the leg to 60 degrees, hold for 3 seconds, and slowly lower the leg.  Perform this exercise against resistance later as your leg gets stronger.  Leg Slides: Lying on your back, slowly slide your foot toward your buttocks, bending your knee up off the floor (only go as far as is comfortable). Then slowly slide your foot back down until your leg is flat on the floor again.  Angel Wings: Lying on your back spread your legs to the side as far apart as you can without causing discomfort.  Hamstring Strength:  Lying on your back, push your heel against the floor with your leg straight by tightening up the muscles of your buttocks.  Repeat, but this time bend your knee to a comfortable angle, and push your heel against the floor.  You may put a pillow under the heel to make it more comfortable if necessary.   A rehabilitation program following joint replacement surgery can speed recovery and prevent re-injury in the future due to weakened muscles. Contact your doctor or a physical therapist for more information on knee rehabilitation.    CONSTIPATION  Constipation is defined medically as fewer than three stools per week and severe constipation as less than one stool per week.  Even if you have a regular bowel pattern at home, your normal regimen is likely to be disrupted due to multiple reasons following surgery.  Combination of anesthesia, postoperative narcotics, change in appetite and fluid intake all can affect your bowels.   YOU MUST use at least one of the following options; they are listed in order of increasing strength to get the job done.  They are all available over the counter, and you may need to use some, POSSIBLY even all of  these options:    Drink plenty of fluids (prune juice may be helpful) and high fiber foods Colace 100 mg by mouth twice a day  Senokot for constipation as directed and as needed Dulcolax (bisacodyl), take with full glass of water  Miralax (polyethylene glycol) once or twice a day as needed.  If you have tried all these things and are unable to have a bowel movement in the first 3-4 days after surgery call either your surgeon or your primary doctor.    If you experience loose stools or diarrhea, hold the medications until you stool forms back up.  If your symptoms do not get better within 1 week or if they get worse, check with your doctor.  If you experience "the worst abdominal pain ever" or develop nausea or vomiting, please contact the office immediately for further recommendations for treatment.   ITCHING:  If you experience itching with your medications, try taking only a single pain pill, or even half a pain pill at a time.  You can also use Benadryl over the counter for itching or also to help with sleep.  TED HOSE STOCKINGS:  Use stockings on both legs until for at least 2 weeks or as directed by physician office. They may be removed at night for sleeping.  MEDICATIONS:  See your medication summary on the "After Visit Summary" that nursing will review with you.  You may have some home medications which will be placed on hold until you complete the course of blood thinner medication.  It is important for you to complete the blood thinner medication as prescribed.  PRECAUTIONS:  If you experience chest pain or shortness of breath - call 911 immediately for transfer to the hospital emergency department.   If you develop a fever greater that 101 F, purulent drainage from wound, increased redness or drainage from wound, foul odor from the wound/dressing, or calf pain - CONTACT YOUR SURGEON.                                                   FOLLOW-UP APPOINTMENTS:  If you do not already have  a post-op appointment, please call the office for an appointment to be seen by your surgeon.  Guidelines for how soon to be seen are listed in your "After Visit Summary", but are typically between 1-4 weeks after surgery.  OTHER INSTRUCTIONS:   Knee Replacement:  Do not place pillow under knee, focus on keeping the knee straight while resting. CPM instructions: 0-90 degrees, 2 hours in the morning, 2 hours in the afternoon, and 2 hours in the evening. Place foam block, curve side up under heel at all times except when in CPM or when walking.  DO NOT modify, tear, cut, or change the foam block in any way.  POST-OPERATIVE OPIOID TAPER INSTRUCTIONS: It is important to wean off of your opioid medication as soon as possible. If you do not need pain medication after your surgery it is ok to stop day one. Opioids include: Codeine, Hydrocodone(Norco, Vicodin), Oxycodone(Percocet, oxycontin) and hydromorphone amongst others.  Long term and even short term use of opiods can cause: Increased pain response Dependence Constipation Depression Respiratory depression And more.  Withdrawal symptoms can include Flu like symptoms Nausea, vomiting And more Techniques to manage these symptoms Hydrate well Eat regular healthy meals Stay active Use relaxation techniques(deep breathing, meditating, yoga) Do Not substitute Alcohol to help with tapering If you have been on opioids for less than two weeks and do not have pain than it is ok to stop all together.  Plan to wean off of opioids This plan should start within one week post op of your joint replacement. Maintain the same interval or time between taking each dose and first decrease the dose.  Cut the total daily intake of opioids by one tablet each day Next start to increase the time between doses. The last dose that should be eliminated is the evening dose.   MAKE SURE YOU:  Understand these instructions.  Get help right away if you are not doing  well or get worse.    Thank you for letting us be a part of your medical care team.  It is a privilege we respect greatly.  We hope these instructions will help you stay on track for a fast and full recovery!    Dental Antibiotics:  In most cases prophylactic antibiotics for Dental procdeures after total joint surgery are not necessary.  Exceptions are  as follows:  1. History of prior total joint infection  2. Severely immunocompromised (Organ Transplant, cancer chemotherapy, Rheumatoid biologic meds such as [place])  3. Poorly controlled diabetes (A1C &gt; 8.0, blood glucose over 200)  If you have one of these conditions, contact your surgeon for an antibiotic prescription, prior to your dental procedure.   Increase activity slowly as tolerated   Complete by: As directed    Post-operative opioid taper instructions:   Complete by: As directed    POST-OPERATIVE OPIOID TAPER INSTRUCTIONS: It is important to wean off of your opioid medication as soon as possible. If you do not need pain medication after your surgery it is ok to stop day one. Opioids include: Codeine, Hydrocodone(Norco, Vicodin), Oxycodone(Percocet, oxycontin) and hydromorphone amongst others.  Long term and even short term use of opiods can cause: Increased pain response Dependence Constipation Depression Respiratory depression And more.  Withdrawal symptoms can include Flu like symptoms Nausea, vomiting And more Techniques to manage these symptoms Hydrate well Eat regular healthy meals Stay active Use relaxation techniques(deep breathing, meditating, yoga) Do Not substitute Alcohol to help with tapering If you have been on opioids for less than two weeks and do not have pain than it is ok to stop all together.  Plan to wean off of opioids This plan should start within one week post op of your joint replacement. Maintain the same interval or time between taking each dose and first decrease the dose.  Cut  the total daily intake of opioids by one tablet each day Next start to increase the time between doses. The last dose that should be eliminated is the evening dose.           Follow-up Information     Nche, Charlene Brooke, NP Follow up.   Specialty: Internal Medicine Contact information: [place] [address] [phone]         [facility] Follow up.   Specialty: Orthopedics Contact information: [place] [address] [phone]        [facility] Follow up.   Specialty: [place] Why: home health PT services will be provided by [facility], start of care within 48 hours post discharge Contact information: [place] [address] [phone]                  Signed: Donella Stade 06/10/2022, 8:39 PM

## 2022-06-11 ENCOUNTER — Ambulatory Visit (INDEPENDENT_AMBULATORY_CARE_PROVIDER_SITE_OTHER): Payer: PRIVATE HEALTH INSURANCE | Admitting: Physical Therapy

## 2022-06-11 ENCOUNTER — Encounter: Payer: Self-pay | Admitting: Physical Therapy

## 2022-06-11 DIAGNOSIS — M6281 Muscle weakness (generalized): Secondary | ICD-10-CM

## 2022-06-11 DIAGNOSIS — R600 Localized edema: Secondary | ICD-10-CM

## 2022-06-11 DIAGNOSIS — R2681 Unsteadiness on feet: Secondary | ICD-10-CM | POA: Diagnosis not present

## 2022-06-11 DIAGNOSIS — M25562 Pain in left knee: Secondary | ICD-10-CM

## 2022-06-11 DIAGNOSIS — M25662 Stiffness of left knee, not elsewhere classified: Secondary | ICD-10-CM

## 2022-06-11 NOTE — Therapy (Signed)
OUTPATIENT PHYSICAL THERAPY LOWER EXTREMITY TREATMENT   Patient Name: Lua Feng MRN: [id number] DOB:10/28/1961, 61 y.o., female Today's Date: 06/11/2022   PT End of Session - 06/11/22 0928     Visit Number 4    Number of Visits 16    Date for PT Re-Evaluation 07/27/22    PT Start Time 0925    PT Stop Time 1014    PT Time Calculation (min) 49 min    Activity Tolerance Patient tolerated treatment well    Behavior During Therapy [facility] for tasks assessed/performed                Past Medical History:  Diagnosis Date   Asthma    Hypertension    Past Surgical History:  Procedure Laterality Date   TOTAL KNEE ARTHROPLASTY Left 05/21/2022   Procedure: LEFT TOTAL KNEE ARTHROPLASTY;  Surgeon: Meredith Pel, MD;  Location: [place];  Service: Orthopedics;  Laterality: Left;   Patient Active Problem List   Diagnosis Date Noted   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   S/P TKR (total knee replacement), left 05/21/2022   Erythema migrans (Lyme disease) 04/27/2022   Hx of healed stress fracture 01/20/2022   FHx: colon cancer 01/20/2022   Arthritis of knee, left 01/20/2022   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   Asthma in adult 11/30/2019   Class 3 severe obesity without serious comorbidity with body mass index (BMI) of 40.0 to 44.9 in adult [facility]) 11/30/2019    PCP: Flossie Buffy, NP  REFERRING PROVIDER: Meredith Pel, MD   REFERRING DIAG: [phone] (ICD-10-CM) - Status post total left knee replacement   THERAPY DIAG:  Acute pain of left knee  Stiffness of left knee, not elsewhere classified  Muscle weakness (generalized)  Unsteadiness on feet  Localized edema  Rationale for Evaluation and Treatment Rehabilitation  ONSET DATE: 05/21/22  SUBJECTIVE:   SUBJECTIVE STATEMENT: Back has been a little more flared up lately, thinks it may be coming from the CPM machine.  Took hydrocodone and muscle relaxer about an hour and  a half ago.   PERTINENT HISTORY: HTN and asthma   PAIN:  Are you having pain? Yes: NPRS scale: 3 currently, up to 5, at best 2/10 Pain location: Lt knee Pain description: aching  Aggravating factors: activity, bending knee Relieving factors: ice, lying down, medication  PRECAUTIONS: None  WEIGHT BEARING RESTRICTIONS No  PATIENT GOALS improve mobility and be able to walk dog, regain use of LLE   OBJECTIVE:   PATIENT SURVEYS:  06/01/22: FOTO 30 (predicted 55)  EDEMA:  06/01/22: Circumferential: joint line - with stocking Lt 49 cm, Rt 41 cm  POSTURE: rounded shoulders and forward head  LOWER EXTREMITY ROM:  Active ROM Right eval Left eval Left 06/08/22 Left 06/11/22  Knee flexion WNL Supine: 82* Supine: 103 Supine:105  Knee extension Seated LAQ: 2* hyperext Seated LAQ: -26* Following quad sets: -21* Seated LAQ: -10 Seated LAQ:  -2   (Blank rows = not tested)   Passive ROM Right eval Left eval Left 06/08/22  Knee flexion  Supine: 92* end-feel limited by pain Supine: 107  Knee extension  Supine: 0*    (Blank rows = not tested)  LOWER EXTREMITY MMT:  MMT Right eval Left eval  Knee flexion  3-/5  Knee extension  3-/5   (Blank rows = not tested)   GAIT: 06/01/22: Distance walked: 100' Assistive device utilized: Environmental consultant - 2 wheeled Level of assistance: Modified independence  Comments: decreased stance on LLE, decreased hip/knee flexion on Lt, mild antalgic gait    TODAY'S TREATMENT: 06/11/22 Therex: NuStep L6 x 8 min Forward step ups onto 4" step without UE support x10 reps bil Lateral step ups onto 4" step x 10 reps bil, no UE support Lateral heel taps with LLE on step 2x10 Lt SAQ 4# 2x10 Supine AA heel slides x 10 reps, Lt Supine SLR on Lt x 10 reps Lt supine SAQ 4# 2x10  Neuro Re-Ed: On foam balance beam: sidestepping x 5 laps, tandem gait fwd/bwd x 5 laps; light UE support   Modalities: Vaso x 10 min; Lt knee mod pressure, 34  deg  06/08/22 Therex: NuStep L6 x 8 min Leg Press bil 75# x 10; 100# 2x10; LLE only 37# 2x10 LAQ with 4# on Lt 2x10 Long sitting quad sets x 20 reps AA heel slides x 10 reps on Lt SAQ 2# on Lt 2x10  Modalities: Vaso x 10 min; Lt knee mod pressure, 34 deg   06/05/22  Precor reclining bike x6 minutes partial rotations at seat 6  Super set: quad sets 2x15 5 second holds + SAQs  2x10 (mod tactile facilitation for quad activation >> SLR or glute set) Constance Haw x15  Retrograde massage for edema control LEs elevated  Knee flexion stretches and overpressure to tolerance   Education throughout session (see below for details)   06/01/22 See HEP - performed trial reps PRN for comprehension and mod cues for technique   PATIENT EDUCATION:  Education details: [place] Person educated: Patient Education method: Explanation, Demonstration, Tactile cues, Verbal cues, and Handouts Education comprehension: verbalized understanding, returned demonstration, verbal cues required, and tactile cues required   HOME EXERCISE PROGRAM: Access Code: [email or web address] URL: [email or web address] Date: 06/01/2022 Prepared by: Faustino Congress  Exercises - Supine Heel Slide with Strap  - 5-10 x daily - 7 x weekly - 2-3 sets - 10 reps - Long Sitting Quad Set  - 5-10 x daily - 7 x weekly - 2-3 sets - 10 reps - 5 sec hold - Seated Long Arc Quad  - 5-10 x daily - 7 x weekly - 2-3 sets - 10 reps - 5 sec hold - Seated Knee Flexion AAROM  - 5-10 x daily - 7 x weekly - 2-3 sets - 10 reps  ASSESSMENT:  CLINICAL IMPRESSION: Pt tolerated session well today with improvement in extension noted today.  Overall progressing well with PT.  Will continue to benefit from PT to maximize function.  OBJECTIVE IMPAIRMENTS Abnormal gait, decreased activity tolerance, decreased balance, decreased knowledge of use of DME, decreased mobility, difficulty walking, decreased ROM, decreased strength, increased edema, and  pain.   ACTIVITY LIMITATIONS carrying, lifting, bending, sitting, standing, squatting, sleeping, stairs, transfers, bed mobility, and locomotion level  PARTICIPATION LIMITATIONS: meal prep, cleaning, laundry, driving, community activity, occupation, and yard work  PERSONAL FACTORS 1-2 comorbidities: HTN and asthma   are also affecting patient's functional outcome.   REHAB POTENTIAL: Good  CLINICAL DECISION MAKING: Evolving/moderate complexity  EVALUATION COMPLEXITY: Moderate   GOALS: Goals reviewed with patient? Yes  SHORT TERM GOALS: Target date: 06/29/2022  Independent with initial HEP Goal status: MET 06/08/22  2.  Lt knee AROM improved to 0-10-95 deg for improved mobility Goal status: MET 06/08/22   LONG TERM GOALS: Target date: 07/27/2022  Independent with final HEP Goal status: INITIAL  2.  FOTO score improved to 55 Goal status: INITIAL  3.  Lt knee AROM improved to 0-110 for improved  mobility Goal status: INIITAL  4.  Report pain < 4/10 with standing and walking activities for improved function, and in preparation for return to work Goal status: INITIAL  5.  Amb without AD without significant deviations independently for improved functional mobility Goal status: INITIAL    PLAN: PT FREQUENCY: 2x/week  PT DURATION: 8 weeks  PLANNED INTERVENTIONS: Therapeutic exercises, Therapeutic activity, Neuromuscular re-education, Balance training, Gait training, Patient/Family education, Self Care, Joint mobilization, Aquatic Therapy, Dry Needling, Electrical stimulation, Cryotherapy, Moist heat, scar mobilization, Taping, Vasopneumatic device, Manual therapy, and Re-evaluation  PLAN FOR NEXT SESSION: quad activation, flexion focus for ROM, standing balance/strengthening exercises, vaso PRN   Laureen Abrahams, PT, DPT 06/11/22 10:06 AM

## 2022-06-15 ENCOUNTER — Encounter: Payer: Self-pay | Admitting: Physical Therapy

## 2022-06-15 ENCOUNTER — Ambulatory Visit (INDEPENDENT_AMBULATORY_CARE_PROVIDER_SITE_OTHER): Payer: PRIVATE HEALTH INSURANCE | Admitting: Physical Therapy

## 2022-06-15 DIAGNOSIS — M6281 Muscle weakness (generalized): Secondary | ICD-10-CM

## 2022-06-15 DIAGNOSIS — M25562 Pain in left knee: Secondary | ICD-10-CM

## 2022-06-15 DIAGNOSIS — M25662 Stiffness of left knee, not elsewhere classified: Secondary | ICD-10-CM

## 2022-06-15 DIAGNOSIS — R2681 Unsteadiness on feet: Secondary | ICD-10-CM

## 2022-06-15 DIAGNOSIS — R600 Localized edema: Secondary | ICD-10-CM

## 2022-06-15 NOTE — Therapy (Signed)
OUTPATIENT PHYSICAL THERAPY LOWER EXTREMITY TREATMENT   Patient Name: Vicki Frazier MRN: [id number] DOB:1960-12-06, 61 y.o., female Today's Date: 06/15/2022   PT End of Session - 06/15/22 1133     Visit Number 5    Number of Visits 16    Date for PT Re-Evaluation 07/27/22    PT Start Time 5361    PT Stop Time 1225    PT Time Calculation (min) 50 min    Activity Tolerance Patient tolerated treatment well    Behavior During Therapy [facility] for tasks assessed/performed                 Past Medical History:  Diagnosis Date   Asthma    Hypertension    Past Surgical History:  Procedure Laterality Date   TOTAL KNEE ARTHROPLASTY Left 05/21/2022   Procedure: LEFT TOTAL KNEE ARTHROPLASTY;  Surgeon: Meredith Pel, MD;  Location: [place];  Service: Orthopedics;  Laterality: Left;   Patient Active Problem List   Diagnosis Date Noted   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   S/P TKR (total knee replacement), left 05/21/2022   Erythema migrans (Lyme disease) 04/27/2022   Hx of healed stress fracture 01/20/2022   FHx: colon cancer 01/20/2022   Arthritis of knee, left 01/20/2022   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   Asthma in adult 11/30/2019   Class 3 severe obesity without serious comorbidity with body mass index (BMI) of 40.0 to 44.9 in adult ([place]) 11/30/2019    PCP: Flossie Buffy, NP  REFERRING PROVIDER: Meredith Pel, MD   REFERRING DIAG: [phone] (ICD-10-CM) - Status post total left knee replacement   THERAPY DIAG:  Acute pain of left knee  Stiffness of left knee, not elsewhere classified  Muscle weakness (generalized)  Unsteadiness on feet  Localized edema  Rationale for Evaluation and Treatment Rehabilitation  ONSET DATE: 05/21/22  SUBJECTIVE:   SUBJECTIVE STATEMENT: Doing pretty well, has a few really good days and then today is more stiff.  Did do more on good days though.  PERTINENT HISTORY: HTN and  asthma   PAIN:  Are you having pain? Yes: NPRS scale: no pain, just stiffness; up to 6 at night/10 Pain location: Lt knee Pain description: aching  Aggravating factors: activity, bending knee Relieving factors: ice, lying down, medication  PRECAUTIONS: None  WEIGHT BEARING RESTRICTIONS No  PATIENT GOALS improve mobility and be able to walk dog, regain use of LLE   OBJECTIVE:   PATIENT SURVEYS:  06/01/22: FOTO 30 (predicted 55)  EDEMA:  06/01/22: Circumferential: joint line - with stocking Lt 49 cm, Rt 41 cm  POSTURE: rounded shoulders and forward head  LOWER EXTREMITY ROM:  Active ROM Right eval Left eval Left 06/08/22 Left 06/11/22 Left 06/15/22  Knee flexion WNL Supine: 82* Supine: 103 Supine:105 Supine: 110  Knee extension Seated LAQ: 2* hyperext Seated LAQ: -26* Following quad sets: -21* Seated LAQ: -10 Seated LAQ:  -2    (Blank rows = not tested)   Passive ROM Right eval Left eval Left 06/08/22 Left 06/15/22  Knee flexion  Supine: 92* end-feel limited by pain Supine: 107 Supine 115  Knee extension  Supine: 0*     (Blank rows = not tested)  LOWER EXTREMITY MMT:  MMT Right eval Left eval  Knee flexion  3-/5  Knee extension  3-/5   (Blank rows = not tested)   GAIT: 06/01/22: Distance walked: 100' Assistive device utilized: Environmental consultant - 2 wheeled Level of  assistance: Modified independence Comments: decreased stance on LLE, decreased hip/knee flexion on Lt, mild antalgic gait    TODAY'S TREATMENT: 06/15/22 Therex: NuStep L6 x 8 min Knee extension machine 5# bil 2x10 Knee flexion machine 10# LLE (min A from RLE) 2x10 Leg Press 100# bil 3x10; LLE only 37# 2x10 Calf raises x 20 reps Sit to/from stand x 10 reps; UEs pushing on thighs Lt SAQ 4# 2x10 Supine AA heel slides 2x10  Neuro Re-Ed Forward step ups onto foam without UE support x10 reps bil LLE SLS 5x10 sec on foam with intermittent UE support Lateral stepping at angles x 5 each  direction  Modalities: Vaso x 10 min; Lt knee mod pressure, 34 deg    06/11/22 Therex: NuStep L6 x 8 min Forward step ups onto 4" step without UE support x10 reps bil Lateral step ups onto 4" step x 10 reps bil, no UE support Lateral heel taps with LLE on step 2x10 Lt SAQ 4# 2x10 Supine AA heel slides x 10 reps, Lt Supine SLR on Lt x 10 reps Lt supine SAQ 4# 2x10  Neuro Re-Ed: On foam balance beam: sidestepping x 5 laps, tandem gait fwd/bwd x 5 laps; light UE support   Modalities: Vaso x 10 min; Lt knee mod pressure, 34 deg  06/08/22 Therex: NuStep L6 x 8 min Leg Press bil 75# x 10; 100# 2x10; LLE only 37# 2x10 LAQ with 4# on Lt 2x10 Long sitting quad sets x 20 reps AA heel slides x 10 reps on Lt SAQ 2# on Lt 2x10  Modalities: Vaso x 10 min; Lt knee mod pressure, 34 deg   06/05/22  Precor reclining bike x6 minutes partial rotations at seat 6  Super set: quad sets 2x15 5 second holds + SAQs  2x10 (mod tactile facilitation for quad activation >> SLR or glute set) Constance Haw x15  Retrograde massage for edema control LEs elevated  Knee flexion stretches and overpressure to tolerance   Education throughout session (see below for details)   PATIENT EDUCATION:  Education details: [place] Person educated: Patient Education method: Explanation, Demonstration, Tactile cues, Verbal cues, and Handouts Education comprehension: verbalized understanding, returned demonstration, verbal cues required, and tactile cues required   HOME EXERCISE PROGRAM: Access Code: [email or web address] URL: [email or web address] Date: 06/01/2022 Prepared by: Faustino Congress  Exercises - Supine Heel Slide with Strap  - 5-10 x daily - 7 x weekly - 2-3 sets - 10 reps - Long Sitting Quad Set  - 5-10 x daily - 7 x weekly - 2-3 sets - 10 reps - 5 sec hold - Seated Long Arc Quad  - 5-10 x daily - 7 x weekly - 2-3 sets - 10 reps - 5 sec hold - Seated Knee Flexion AAROM  - 5-10 x daily - 7 x  weekly - 2-3 sets - 10 reps  ASSESSMENT:  CLINICAL IMPRESSION: Steady improvement in flexion ROM noted, and overall continued progress with PT.  Progressing amb to work on weaning from [facility] in the next few weeks.  Will continue to benefit from PT to maximize function.  OBJECTIVE IMPAIRMENTS Abnormal gait, decreased activity tolerance, decreased balance, decreased knowledge of use of DME, decreased mobility, difficulty walking, decreased ROM, decreased strength, increased edema, and pain.   ACTIVITY LIMITATIONS carrying, lifting, bending, sitting, standing, squatting, sleeping, stairs, transfers, bed mobility, and locomotion level  PARTICIPATION LIMITATIONS: meal prep, cleaning, laundry, driving, community activity, occupation, and yard work  PERSONAL FACTORS 1-2 comorbidities: HTN and asthma   are also  affecting patient's functional outcome.   REHAB POTENTIAL: Good  CLINICAL DECISION MAKING: Evolving/moderate complexity  EVALUATION COMPLEXITY: Moderate   GOALS: Goals reviewed with patient? Yes  SHORT TERM GOALS: Target date: 06/29/2022  Independent with initial HEP Goal status: MET 06/08/22  2.  Lt knee AROM improved to 0-10-95 deg for improved mobility Goal status: MET 06/08/22   LONG TERM GOALS: Target date: 07/27/2022  Independent with final HEP Goal status: INITIAL  2.  FOTO score improved to 55 Goal status: INITIAL  3.  Lt knee AROM improved to 0-110 for improved mobility Goal status: INIITAL  4.  Report pain < 4/10 with standing and walking activities for improved function, and in preparation for return to work Goal status: INITIAL  5.  Amb without AD without significant deviations independently for improved functional mobility Goal status: INITIAL    PLAN: PT FREQUENCY: 2x/week  PT DURATION: 8 weeks  PLANNED INTERVENTIONS: Therapeutic exercises, Therapeutic activity, Neuromuscular re-education, Balance training, Gait training, Patient/Family education, Self  Care, Joint mobilization, Aquatic Therapy, Dry Needling, Electrical stimulation, Cryotherapy, Moist heat, scar mobilization, Taping, Vasopneumatic device, Manual therapy, and Re-evaluation  PLAN FOR NEXT SESSION: update HEP to add additional strengthening/balance activities,  quad activation, flexion focus for ROM, standing balance/strengthening exercises, vaso PRN   Laureen Abrahams, PT, DPT 06/15/22 12:36 PM

## 2022-06-18 ENCOUNTER — Ambulatory Visit (INDEPENDENT_AMBULATORY_CARE_PROVIDER_SITE_OTHER): Payer: PRIVATE HEALTH INSURANCE | Admitting: Physical Therapy

## 2022-06-18 ENCOUNTER — Encounter: Payer: Self-pay | Admitting: Physical Therapy

## 2022-06-18 DIAGNOSIS — M25662 Stiffness of left knee, not elsewhere classified: Secondary | ICD-10-CM

## 2022-06-18 DIAGNOSIS — M6281 Muscle weakness (generalized): Secondary | ICD-10-CM | POA: Diagnosis not present

## 2022-06-18 DIAGNOSIS — R600 Localized edema: Secondary | ICD-10-CM

## 2022-06-18 DIAGNOSIS — R2681 Unsteadiness on feet: Secondary | ICD-10-CM

## 2022-06-18 DIAGNOSIS — M25562 Pain in left knee: Secondary | ICD-10-CM | POA: Diagnosis not present

## 2022-06-18 NOTE — Therapy (Signed)
OUTPATIENT PHYSICAL THERAPY LOWER EXTREMITY TREATMENT   Patient Name: Vicki Frazier MRN: [id number] DOB:19-Feb-1961, 61 y.o., female Today's Date: 06/18/2022   PT End of Session - 06/18/22 1017     Visit Number 6    Number of Visits 16    Date for PT Re-Evaluation 07/27/22    PT Start Time 3646    PT Stop Time 1103    PT Time Calculation (min) 48 min    Activity Tolerance Patient tolerated treatment well    Behavior During Therapy [facility] for tasks assessed/performed                  Past Medical History:  Diagnosis Date   Asthma    Hypertension    Past Surgical History:  Procedure Laterality Date   TOTAL KNEE ARTHROPLASTY Left 05/21/2022   Procedure: LEFT TOTAL KNEE ARTHROPLASTY;  Surgeon: Meredith Pel, MD;  Location: [place];  Service: Orthopedics;  Laterality: Left;   Patient Active Problem List   Diagnosis Date Noted   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   S/P TKR (total knee replacement), left 05/21/2022   Erythema migrans (Lyme disease) 04/27/2022   Hx of healed stress fracture 01/20/2022   FHx: colon cancer 01/20/2022   Arthritis of knee, left 01/20/2022   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   Asthma in adult 11/30/2019   Class 3 severe obesity without serious comorbidity with body mass index (BMI) of 40.0 to 44.9 in adult ([place]) 11/30/2019    PCP: Flossie Buffy, NP  REFERRING PROVIDER: Meredith Pel, MD   REFERRING DIAG: [phone] (ICD-10-CM) - Status post total left knee replacement   THERAPY DIAG:  Acute pain of left knee  Stiffness of left knee, not elsewhere classified  Muscle weakness (generalized)  Unsteadiness on feet  Localized edema  Rationale for Evaluation and Treatment Rehabilitation  ONSET DATE: 05/21/22  SUBJECTIVE:   SUBJECTIVE STATEMENT: Lt knee is still very stiff today, having a hard time getting it to loosen up.  PERTINENT HISTORY: HTN and asthma   PAIN:  Are you  having pain? Yes: NPRS scale: no pain, just stiffness; up to 6 at night/10 Pain location: Lt knee Pain description: aching  Aggravating factors: activity, bending knee Relieving factors: ice, lying down, medication  PRECAUTIONS: None  WEIGHT BEARING RESTRICTIONS No  PATIENT GOALS improve mobility and be able to walk dog, regain use of LLE   OBJECTIVE:   PATIENT SURVEYS:  06/01/22: FOTO 30 (predicted 55)  EDEMA:  06/01/22: Circumferential: joint line - with stocking Lt 49 cm, Rt 41 cm  POSTURE: rounded shoulders and forward head  LOWER EXTREMITY ROM:  Active ROM Right eval Left eval Left 06/08/22 Left 06/11/22 Left 06/15/22  Knee flexion WNL Supine: 82* Supine: 103 Supine:105 Supine: 110  Knee extension Seated LAQ: 2* hyperext Seated LAQ: -26* Following quad sets: -21* Seated LAQ: -10 Seated LAQ:  -2    (Blank rows = not tested)   Passive ROM Right eval Left eval Left 06/08/22 Left 06/15/22  Knee flexion  Supine: 92* end-feel limited by pain Supine: 107 Supine 115  Knee extension  Supine: 0*     (Blank rows = not tested)  LOWER EXTREMITY MMT:  MMT Right eval Left eval  Knee flexion  3-/5  Knee extension  3-/5   (Blank rows = not tested)   GAIT: 06/01/22: Distance walked: 100' Assistive device utilized: Environmental consultant - 2 wheeled Level of assistance: Modified independence Comments: decreased stance  on LLE, decreased hip/knee flexion on Lt, mild antalgic gait    TODAY'S TREATMENT: 06/18/22 Therex: NuStep L6 x 8 min Forward step ups onto 6" step x 10 reps bil Lateral step ups onto 6" step x 10 reps bil Leg Press 100# bil 3x10; LLE only 37# 2x10 Sidestepping x 5 laps with L3 band around ankles Backwards walking x 5 laps with L3 band around ankles  Neuro Re-Ed: Rockerboard: Ant/post weight shift; then horizontal head turns; Lateral weight shift Sidestepping on foam x 5 laps in // bars with intermittent UE support Tandem forward/backward on foam with UE  support  Modalities: Vaso x 10 min; Lt knee mod pressure, 34 deg  06/15/22 Therex: NuStep L6 x 8 min Knee extension machine 5# bil 2x10 Knee flexion machine 10# LLE (min A from RLE) 2x10 Leg Press 100# bil 3x10; LLE only 37# 2x10 Calf raises x 20 reps Sit to/from stand x 10 reps; UEs pushing on thighs Lt SAQ 4# 2x10 Supine AA heel slides 2x10  Neuro Re-Ed Forward step ups onto foam without UE support x10 reps bil LLE SLS 5x10 sec on foam with intermittent UE support Lateral stepping at angles x 5 each direction  Modalities: Vaso x 10 min; Lt knee mod pressure, 34 deg    06/11/22 Therex: NuStep L6 x 8 min Forward step ups onto 4" step without UE support x10 reps bil Lateral step ups onto 4" step x 10 reps bil, no UE support Lateral heel taps with LLE on step 2x10 Lt SAQ 4# 2x10 Supine AA heel slides x 10 reps, Lt Supine SLR on Lt x 10 reps Lt supine SAQ 4# 2x10  Neuro Re-Ed: On foam balance beam: sidestepping x 5 laps, tandem gait fwd/bwd x 5 laps; light UE support   Modalities: Vaso x 10 min; Lt knee mod pressure, 34 deg  06/08/22 Therex: NuStep L6 x 8 min Leg Press bil 75# x 10; 100# 2x10; LLE only 37# 2x10 LAQ with 4# on Lt 2x10 Long sitting quad sets x 20 reps AA heel slides x 10 reps on Lt SAQ 2# on Lt 2x10  Modalities: Vaso x 10 min; Lt knee mod pressure, 34 deg  PATIENT EDUCATION:  Education details: [id number] Person educated: Patient Education method: Explanation, Demonstration, Tactile cues, Verbal cues, and Handouts Education comprehension: verbalized understanding, returned demonstration, verbal cues required, and tactile cues required   HOME EXERCISE PROGRAM: Access Code: [email or web address] URL: [email or web address] Date: 06/18/2022 Prepared by: Faustino Congress  Exercises - Supine Heel Slide with Strap  - 5-10 x daily - 7 x weekly - 2-3 sets - 10 reps - Long Sitting Quad Set  - 5-10 x daily - 7 x weekly - 2-3 sets - 10 reps - 5 sec  hold - Seated Long Arc Quad  - 5-10 x daily - 7 x weekly - 2-3 sets - 10 reps - 5 sec hold - Seated Knee Flexion AAROM  - 5-10 x daily - 7 x weekly - 2-3 sets - 10 reps - Mini Squat with Counter Support  - 1 x daily - 7 x weekly - 2 sets - 10 reps - Standing Hip Abduction with Counter Support  - 1 x daily - 7 x weekly - 1 sets - 20 reps - Standing Hip Extension with Counter Support  - 1 x daily - 7 x weekly - 1 sets - 20 reps - Heel Raises with Counter Support  - 1 x daily - 7 x weekly - 1 sets -  20 reps  ASSESSMENT:  CLINICAL IMPRESSION: Progressing well with PT, continued work on strengthening and balance today.  Will continue to benefit from PT to maximize function.  HEP updated today as well with L3 band provided.  OBJECTIVE IMPAIRMENTS Abnormal gait, decreased activity tolerance, decreased balance, decreased knowledge of use of DME, decreased mobility, difficulty walking, decreased ROM, decreased strength, increased edema, and pain.   ACTIVITY LIMITATIONS carrying, lifting, bending, sitting, standing, squatting, sleeping, stairs, transfers, bed mobility, and locomotion level  PARTICIPATION LIMITATIONS: meal prep, cleaning, laundry, driving, community activity, occupation, and yard work  PERSONAL FACTORS 1-2 comorbidities: HTN and asthma   are also affecting patient's functional outcome.   REHAB POTENTIAL: Good  CLINICAL DECISION MAKING: Evolving/moderate complexity  EVALUATION COMPLEXITY: Moderate   GOALS: Goals reviewed with patient? Yes  SHORT TERM GOALS: Target date: 06/29/2022  Independent with initial HEP Goal status: MET 06/08/22  2.  Lt knee AROM improved to 0-10-95 deg for improved mobility Goal status: MET 06/08/22   LONG TERM GOALS: Target date: 07/27/2022  Independent with final HEP Goal status: INITIAL  2.  FOTO score improved to 55 Goal status: INITIAL  3.  Lt knee AROM improved to 0-110 for improved mobility Goal status: INIITAL  4.  Report pain <  4/10 with standing and walking activities for improved function, and in preparation for return to work Goal status: INITIAL  5.  Amb without AD without significant deviations independently for improved functional mobility Goal status: INITIAL    PLAN: PT FREQUENCY: 2x/week  PT DURATION: 8 weeks  PLANNED INTERVENTIONS: Therapeutic exercises, Therapeutic activity, Neuromuscular re-education, Balance training, Gait training, Patient/Family education, Self Care, Joint mobilization, Aquatic Therapy, Dry Needling, Electrical stimulation, Cryotherapy, Moist heat, scar mobilization, Taping, Vasopneumatic device, Manual therapy, and Re-evaluation  PLAN FOR NEXT SESSION: review updated HEP, quad activation, flexion focus for ROM, standing balance/strengthening exercises, vaso PRN   Laureen Abrahams, PT, DPT 06/18/22 11:09 AM

## 2022-06-22 ENCOUNTER — Encounter: Payer: Self-pay | Admitting: Physical Therapy

## 2022-06-22 ENCOUNTER — Ambulatory Visit (INDEPENDENT_AMBULATORY_CARE_PROVIDER_SITE_OTHER): Payer: PRIVATE HEALTH INSURANCE | Admitting: Physical Therapy

## 2022-06-22 DIAGNOSIS — M6281 Muscle weakness (generalized): Secondary | ICD-10-CM | POA: Diagnosis not present

## 2022-06-22 DIAGNOSIS — M25562 Pain in left knee: Secondary | ICD-10-CM

## 2022-06-22 DIAGNOSIS — M25662 Stiffness of left knee, not elsewhere classified: Secondary | ICD-10-CM | POA: Diagnosis not present

## 2022-06-22 DIAGNOSIS — R2681 Unsteadiness on feet: Secondary | ICD-10-CM

## 2022-06-22 DIAGNOSIS — R600 Localized edema: Secondary | ICD-10-CM

## 2022-06-22 NOTE — Therapy (Signed)
OUTPATIENT PHYSICAL THERAPY LOWER EXTREMITY TREATMENT   Patient Name: Vicki Frazier MRN: [id number] DOB:1961/07/13, 61 y.o., female Today's Date: 06/22/2022   PT End of Session - 06/22/22 0847     Visit Number 7    Number of Visits 16    Date for PT Re-Evaluation 07/27/22    PT Start Time 0845    PT Stop Time 0923    PT Time Calculation (min) 38 min    Activity Tolerance Patient tolerated treatment well    Behavior During Therapy [facility] for tasks assessed/performed                   Past Medical History:  Diagnosis Date   Asthma    Hypertension    Past Surgical History:  Procedure Laterality Date   TOTAL KNEE ARTHROPLASTY Left 05/21/2022   Procedure: LEFT TOTAL KNEE ARTHROPLASTY;  Surgeon: Meredith Pel, MD;  Location: [place];  Service: Orthopedics;  Laterality: Left;   Patient Active Problem List   Diagnosis Date Noted   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   S/P TKR (total knee replacement), left 05/21/2022   Erythema migrans (Lyme disease) 04/27/2022   Hx of healed stress fracture 01/20/2022   FHx: colon cancer 01/20/2022   Arthritis of knee, left 01/20/2022   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   Asthma in adult 11/30/2019   Class 3 severe obesity without serious comorbidity with body mass index (BMI) of 40.0 to 44.9 in adult ([place]) 11/30/2019    PCP: Flossie Buffy, NP  REFERRING PROVIDER: Meredith Pel, MD   REFERRING DIAG: [phone] (ICD-10-CM) - Status post total left knee replacement   THERAPY DIAG:  Acute pain of left knee  Stiffness of left knee, not elsewhere classified  Muscle weakness (generalized)  Unsteadiness on feet  Localized edema  Rationale for Evaluation and Treatment Rehabilitation  ONSET DATE: 05/21/22  SUBJECTIVE:   SUBJECTIVE STATEMENT: Came to PT without cane today; reports a rough weekend with more dog walking than typical; feels better this morning  PERTINENT  HISTORY: HTN and asthma   PAIN:  Are you having pain? Yes: NPRS scale: no pain, just stiffness; up to 6 at night/10 Pain location: Lt knee Pain description: aching  Aggravating factors: activity, bending knee Relieving factors: ice, lying down, medication  PRECAUTIONS: None  WEIGHT BEARING RESTRICTIONS No  PATIENT GOALS improve mobility and be able to walk dog, regain use of LLE   OBJECTIVE:   PATIENT SURVEYS:  06/01/22: FOTO 30 (predicted 55)  EDEMA:  06/01/22: Circumferential: joint line - with stocking Lt 49 cm, Rt 41 cm  POSTURE: rounded shoulders and forward head  LOWER EXTREMITY ROM:  Active ROM Right eval Left eval Left 06/08/22 Left 06/11/22 Left 06/15/22 Left 06/22/22  Knee flexion WNL Supine: 82* Supine: 103 Supine:105 Supine: 110 Supine: 111  Knee extension Seated LAQ: 2* hyperext Seated LAQ: -26* Following quad sets: -21* Seated LAQ: -10 Seated LAQ:  -2     (Blank rows = not tested)   Passive ROM Right eval Left eval Left 06/08/22 Left 06/15/22  Knee flexion  Supine: 92* end-feel limited by pain Supine: 107 Supine 115  Knee extension  Supine: 0*     (Blank rows = not tested)  LOWER EXTREMITY MMT:  MMT Right eval Left eval  Knee flexion  3-/5  Knee extension  3-/5   (Blank rows = not tested)   GAIT: 06/01/22: Distance walked: 100' Assistive device utilized: Environmental consultant -  2 wheeled Level of assistance: Modified independence Comments: decreased stance on LLE, decreased hip/knee flexion on Lt, mild antalgic gait    TODAY'S TREATMENT: 06/22/22 Therex: NuStep L6 x 8 min Knee extension machine 5# LLE only 3x10 Knee flexion machine 10# 3x10 LLE only Leg Press 106# 3x10; LLE only 37# 3x10 Calf raises x 20 reps Supine AA heel slides x 15 reps; Lt Seated LAQ 5# Lt; 3x10 Sit fo/from stand x 10 reps without UE support   Neuro Re-Ed Feet together on foam: EO with horizontal/vertical head turns x 10 each Tandem stance on foam 3x30 sec bil;  intermittent UE support needed  06/18/22 Therex: NuStep L6 x 8 min Forward step ups onto 6" step x 10 reps bil Lateral step ups onto 6" step x 10 reps bil Leg Press 100# bil 3x10; LLE only 37# 2x10 Sidestepping x 5 laps with L3 band around ankles Backwards walking x 5 laps with L3 band around ankles  Neuro Re-Ed: Rockerboard: Ant/post weight shift; then horizontal head turns; Lateral weight shift Sidestepping on foam x 5 laps in // bars with intermittent UE support Tandem forward/backward on foam with UE support  Modalities: Vaso x 10 min; Lt knee mod pressure, 34 deg  06/15/22 Therex: NuStep L6 x 8 min Knee extension machine 5# bil 2x10 Knee flexion machine 10# LLE (min A from RLE) 2x10 Leg Press 100# bil 3x10; LLE only 37# 2x10 Calf raises x 20 reps Sit to/from stand x 10 reps; UEs pushing on thighs Lt SAQ 4# 2x10 Supine AA heel slides 2x10  Neuro Re-Ed Forward step ups onto foam without UE support x10 reps bil LLE SLS 5x10 sec on foam with intermittent UE support Lateral stepping at angles x 5 each direction  Modalities: Vaso x 10 min; Lt knee mod pressure, 34 deg    PATIENT EDUCATION:  Education details: [id number] Person educated: Patient Education method: Explanation, Demonstration, Tactile cues, Verbal cues, and Handouts Education comprehension: verbalized understanding, returned demonstration, verbal cues required, and tactile cues required   HOME EXERCISE PROGRAM: Access Code: [email or web address] URL: [email or web address] Date: 06/18/2022 Prepared by: Faustino Congress  Exercises - Supine Heel Slide with Strap  - 5-10 x daily - 7 x weekly - 2-3 sets - 10 reps - Long Sitting Quad Set  - 5-10 x daily - 7 x weekly - 2-3 sets - 10 reps - 5 sec hold - Seated Long Arc Quad  - 5-10 x daily - 7 x weekly - 2-3 sets - 10 reps - 5 sec hold - Seated Knee Flexion AAROM  - 5-10 x daily - 7 x weekly - 2-3 sets - 10 reps - Mini Squat with Counter Support  - 1 x  daily - 7 x weekly - 2 sets - 10 reps - Standing Hip Abduction with Counter Support  - 1 x daily - 7 x weekly - 1 sets - 20 reps - Standing Hip Extension with Counter Support  - 1 x daily - 7 x weekly - 1 sets - 20 reps - Heel Raises with Counter Support  - 1 x daily - 7 x weekly - 1 sets - 20 reps  ASSESSMENT:  CLINICAL IMPRESSION: Pt tolerated session well today and continues to progress well with PT. Amb without AD in clinic today without significant deviations or evidence of imbalance.  Will continue to benefit from PT to maximize function.  OBJECTIVE IMPAIRMENTS Abnormal gait, decreased activity tolerance, decreased balance, decreased knowledge of use of DME, decreased mobility, difficulty  walking, decreased ROM, decreased strength, increased edema, and pain.   ACTIVITY LIMITATIONS carrying, lifting, bending, sitting, standing, squatting, sleeping, stairs, transfers, bed mobility, and locomotion level  PARTICIPATION LIMITATIONS: meal prep, cleaning, laundry, driving, community activity, occupation, and yard work  PERSONAL FACTORS 1-2 comorbidities: HTN and asthma   are also affecting patient's functional outcome.   REHAB POTENTIAL: Good  CLINICAL DECISION MAKING: Evolving/moderate complexity  EVALUATION COMPLEXITY: Moderate   GOALS: Goals reviewed with patient? Yes  SHORT TERM GOALS: Target date: 06/29/2022  Independent with initial HEP Goal status: MET 06/08/22  2.  Lt knee AROM improved to 0-10-95 deg for improved mobility Goal status: MET 06/08/22   LONG TERM GOALS: Target date: 07/27/2022  Independent with final HEP Goal status: INITIAL  2.  FOTO score improved to 55 Goal status: INITIAL  3.  Lt knee AROM improved to 0-110 for improved mobility Goal status: INIITAL  4.  Report pain < 4/10 with standing and walking activities for improved function, and in preparation for return to work Goal status: INITIAL  5.  Amb without AD without significant deviations  independently for improved functional mobility Goal status: INITIAL    PLAN: PT FREQUENCY: 2x/week  PT DURATION: 8 weeks  PLANNED INTERVENTIONS: Therapeutic exercises, Therapeutic activity, Neuromuscular re-education, Balance training, Gait training, Patient/Family education, Self Care, Joint mobilization, Aquatic Therapy, Dry Needling, Electrical stimulation, Cryotherapy, Moist heat, scar mobilization, Taping, Vasopneumatic device, Manual therapy, and Re-evaluation  PLAN FOR NEXT SESSION:  measure extension, flexion focus for ROM, standing balance/strengthening exercises, vaso PRN   Laureen Abrahams, PT, DPT 06/22/22 9:25 AM

## 2022-06-23 ENCOUNTER — Ambulatory Visit
Admission: RE | Admit: 2022-06-23 | Discharge: 2022-06-23 | Disposition: A | Discharge status: Home / Self Care | Payer: PRIVATE HEALTH INSURANCE | Source: Ambulatory Visit | Attending: Nurse Practitioner | Admitting: Nurse Practitioner

## 2022-06-23 DIAGNOSIS — Z780 Asymptomatic menopausal state: Secondary | ICD-10-CM

## 2022-06-23 DIAGNOSIS — Z1231 Encounter for screening mammogram for malignant neoplasm of breast: Secondary | ICD-10-CM

## 2022-06-23 DIAGNOSIS — Z87312 Personal history of (healed) stress fracture: Secondary | ICD-10-CM

## 2022-06-25 ENCOUNTER — Encounter: Payer: Self-pay | Admitting: Rehabilitative and Restorative Service Providers"

## 2022-06-25 ENCOUNTER — Ambulatory Visit (INDEPENDENT_AMBULATORY_CARE_PROVIDER_SITE_OTHER): Payer: PRIVATE HEALTH INSURANCE | Admitting: Rehabilitative and Restorative Service Providers"

## 2022-06-25 DIAGNOSIS — M25662 Stiffness of left knee, not elsewhere classified: Secondary | ICD-10-CM | POA: Diagnosis not present

## 2022-06-25 DIAGNOSIS — R600 Localized edema: Secondary | ICD-10-CM

## 2022-06-25 DIAGNOSIS — M25562 Pain in left knee: Secondary | ICD-10-CM | POA: Diagnosis not present

## 2022-06-25 DIAGNOSIS — R2681 Unsteadiness on feet: Secondary | ICD-10-CM | POA: Diagnosis not present

## 2022-06-25 DIAGNOSIS — M6281 Muscle weakness (generalized): Secondary | ICD-10-CM

## 2022-06-25 NOTE — Therapy (Signed)
OUTPATIENT PHYSICAL THERAPY TREATMENT   Patient Name: Vicki Frazier MRN: [id number] DOB:[birth year], 61 y.o., female Today's Date: 06/25/2022   PT End of Session - 06/25/22 0937     Visit Number 8    Number of Visits 16    Date for PT Re-Evaluation 07/27/22    PT Start Time 0930    PT Stop Time 1009    PT Time Calculation (min) 39 min    Activity Tolerance Patient tolerated treatment well    Behavior During Therapy [facility] for tasks assessed/performed                    Past Medical History:  Diagnosis Date   Asthma    Hypertension    Past Surgical History:  Procedure Laterality Date   TOTAL KNEE ARTHROPLASTY Left 05/21/2022   Procedure: LEFT TOTAL KNEE ARTHROPLASTY;  Surgeon: Meredith Pel, MD;  Location: [place];  Service: Orthopedics;  Laterality: Left;   Patient Active Problem List   Diagnosis Date Noted   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   S/P TKR (total knee replacement), left 05/21/2022   Erythema migrans (Lyme disease) 04/27/2022   Hx of healed stress fracture 01/20/2022   FHx: colon cancer 01/20/2022   Arthritis of knee, left 01/20/2022   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   Asthma in adult 11/30/2019   Class 3 severe obesity without serious comorbidity with body mass index (BMI) of 40.0 to 44.9 in adult ([place]) 11/30/2019    PCP: Flossie Buffy, NP  REFERRING PROVIDER: Meredith Pel, MD   REFERRING DIAG: [phone] (ICD-10-CM) - Status post total left knee replacement   THERAPY DIAG:  Acute pain of left knee  Stiffness of left knee, not elsewhere classified  Muscle weakness (generalized)  Unsteadiness on feet  Localized edema  Rationale for Evaluation and Treatment Rehabilitation  ONSET DATE: 05/21/22  SUBJECTIVE:   SUBJECTIVE STATEMENT: Pt indicated some pain in back of knee and side of knee, noticed swelling.  Felt tight this morning.  Tried bike at gym and went around.   PERTINENT  HISTORY: HTN and asthma   PAIN:  NPRS scale: 3/10 Pain location: Lt knee Pain description: aching, tightness Aggravating factors: activity, bending knee Relieving factors: ice, lying down, medication  PRECAUTIONS: None  WEIGHT BEARING RESTRICTIONS No  PATIENT GOALS improve mobility and be able to walk dog, regain use of LLE   OBJECTIVE:   PATIENT SURVEYS:  06/01/22: FOTO 30 (predicted 55)  EDEMA:  06/01/22: Circumferential: joint line - with stocking Lt 49 cm, Rt 41 cm  POSTURE: rounded shoulders and forward head  LOWER EXTREMITY ROM:  Active ROM Right eval Left eval Left 06/08/22 Left 06/11/22 Left 06/15/22 Left 06/22/22 Left   Knee flexion WNL Supine: 82* Supine: 103 Supine:105 Supine: 110 Supine: 111   Knee extension Seated LAQ: 2* hyperext Seated LAQ: -26* Following quad sets: -21* Seated LAQ: -10 Seated LAQ:  -2   Seated LAQ 0 degrees   (Blank rows = not tested)   Passive ROM Right eval Left eval Left 06/08/22 Left 06/15/22  Knee flexion  Supine: 92* end-feel limited by pain Supine: 107 Supine 115  Knee extension  Supine: 0*     (Blank rows = not tested)  LOWER EXTREMITY MMT:  MMT Right eval Left eval Left 06/25/2022  Knee flexion  3-/5 5/5  Knee extension  3-/5 5/5   (Blank rows = not tested)   GAIT: 06/25/2022:  independent ambulation  06/01/22: Distance walked: 100' Assistive device utilized: Environmental consultant - 2 wheeled Level of assistance: Modified independence Comments: decreased stance on LLE, decreased hip/knee flexion on Lt, mild antalgic gait    TODAY'S TREATMENT: 06/25/2022 Therex: Recumbent bike lvl 2, seat 7, 6 mins Knee extension machine double leg up, Lt leg lowering 10 lbs 2 x 10  Knee flexion machine 15 lbs 2x10 LLE only Leg Press 112 lbs 2 x 10,    LLE only 43 lbs 2 x 10  Calf raises on incline board 2 x 10 c gastroc stretch 30 sec after each set then 1 extra calf stretch after for 3 total Lateral step down 6 inch step WB on Lt x  15 Step on over and down WB on Lt x 15 4 inch step   Neuro Re-Ed Tandem ambulation on foam beam 6 ft x 6 fwd/back each in //bars Tandem stance on foam 1 min x 2 bilateral c intermittent occasional HHA on bars  06/22/22 Therex: NuStep L6 x 8 min Knee extension machine 5# LLE only 3x10 Knee flexion machine 10# 3x10 LLE only Leg Press 106# 3x10; LLE only 37# 3x10 Calf raises x 20 reps Supine AA heel slides x 15 reps; Lt Seated LAQ 5# Lt; 3x10 Sit fo/from stand x 10 reps without UE support   Neuro Re-Ed Feet together on foam: EO with horizontal/vertical head turns x 10 each Tandem stance on foam 3x30 sec bil; intermittent UE support needed  06/18/22 Therex: NuStep L6 x 8 min Forward step ups onto 6" step x 10 reps bil Lateral step ups onto 6" step x 10 reps bil Leg Press 100# bil 3x10; LLE only 37# 2x10 Sidestepping x 5 laps with L3 band around ankles Backwards walking x 5 laps with L3 band around ankles  Neuro Re-Ed: Rockerboard: Ant/post weight shift; then horizontal head turns; Lateral weight shift Sidestepping on foam x 5 laps in // bars with intermittent UE support Tandem forward/backward on foam with UE support  Modalities: Vaso x 10 min; Lt knee mod pressure, 34 deg   PATIENT EDUCATION:  Education details: [email or web address] Person educated: Patient Education method: Explanation, Demonstration, Tactile cues, Verbal cues, and Handouts Education comprehension: verbalized understanding, returned demonstration, verbal cues required, and tactile cues required   HOME EXERCISE PROGRAM: Access Code: [email or web address] URL: [email or web address] Date: 06/18/2022 Prepared by: Faustino Congress  Exercises - Supine Heel Slide with Strap  - 5-10 x daily - 7 x weekly - 2-3 sets - 10 reps - Long Sitting Quad Set  - 5-10 x daily - 7 x weekly - 2-3 sets - 10 reps - 5 sec hold - Seated Long Arc Quad  - 5-10 x daily - 7 x weekly - 2-3 sets - 10 reps - 5 sec hold - Seated Knee  Flexion AAROM  - 5-10 x daily - 7 x weekly - 2-3 sets - 10 reps - Mini Squat with Counter Support  - 1 x daily - 7 x weekly - 2 sets - 10 reps - Standing Hip Abduction with Counter Support  - 1 x daily - 7 x weekly - 1 sets - 20 reps - Standing Hip Extension with Counter Support  - 1 x daily - 7 x weekly - 1 sets - 20 reps - Heel Raises with Counter Support  - 1 x daily - 7 x weekly - 1 sets - 20 reps  ASSESSMENT:  CLINICAL IMPRESSION: Noted improvement in ability to reach full extension and also gains in muscle strength.  Plan to check muscle strength with dynamometer in future for more in depth update of strength.  Gains noted in independent ambulation and good overall progression as noted.   OBJECTIVE IMPAIRMENTS Abnormal gait, decreased activity tolerance, decreased balance, decreased knowledge of use of DME, decreased mobility, difficulty walking, decreased ROM, decreased strength, increased edema, and pain.   ACTIVITY LIMITATIONS carrying, lifting, bending, sitting, standing, squatting, sleeping, stairs, transfers, bed mobility, and locomotion level  PARTICIPATION LIMITATIONS: meal prep, cleaning, laundry, driving, community activity, occupation, and yard work  PERSONAL FACTORS 1-2 comorbidities: HTN and asthma   are also affecting patient's functional outcome.   REHAB POTENTIAL: Good  CLINICAL DECISION MAKING: Evolving/moderate complexity  EVALUATION COMPLEXITY: Moderate   GOALS: Goals reviewed with patient? Yes  SHORT TERM GOALS: Target date: 06/29/2022  Independent with initial HEP Goal status: MET 06/08/22  2.  Lt knee AROM improved to 0-10-95 deg for improved mobility Goal status: MET 06/08/22   LONG TERM GOALS: Target date: 07/27/2022  Independent with final HEP Goal status: on going - assessed 06/25/2022  2.  FOTO score improved to 55 Goal status: Met - assessed 06/25/2022  3.  Lt knee AROM improved to 0-110 for improved mobility Goal status: Met - assessed  06/25/2022  4.  Report pain < 4/10 with standing and walking activities for improved function, and in preparation for return to work Goal status: on going - assessed 06/25/2022  5.  Amb without AD without significant deviations independently for improved functional mobility Goal status: on going - assessed 06/25/2022    PLAN: PT FREQUENCY: 2x/week  PT DURATION: 8 weeks  PLANNED INTERVENTIONS: Therapeutic exercises, Therapeutic activity, Neuromuscular re-education, Balance training, Gait training, Patient/Family education, Self Care, Joint mobilization, Aquatic Therapy, Dry Needling, Electrical stimulation, Cryotherapy, Moist heat, scar mobilization, Taping, Vasopneumatic device, Manual therapy, and Re-evaluation  PLAN FOR NEXT SESSION:  Stair navigation improvements/strengthening   Scot Jun, PT, DPT, OCS, ATC 06/25/22  10:06 AM

## 2022-06-29 ENCOUNTER — Ambulatory Visit (INDEPENDENT_AMBULATORY_CARE_PROVIDER_SITE_OTHER): Payer: PRIVATE HEALTH INSURANCE | Admitting: Physical Therapy

## 2022-06-29 ENCOUNTER — Encounter: Payer: Self-pay | Admitting: Physical Therapy

## 2022-06-29 DIAGNOSIS — M25662 Stiffness of left knee, not elsewhere classified: Secondary | ICD-10-CM | POA: Diagnosis not present

## 2022-06-29 DIAGNOSIS — R600 Localized edema: Secondary | ICD-10-CM

## 2022-06-29 DIAGNOSIS — M6281 Muscle weakness (generalized): Secondary | ICD-10-CM | POA: Diagnosis not present

## 2022-06-29 DIAGNOSIS — M25562 Pain in left knee: Secondary | ICD-10-CM | POA: Diagnosis not present

## 2022-06-29 DIAGNOSIS — R2681 Unsteadiness on feet: Secondary | ICD-10-CM | POA: Diagnosis not present

## 2022-06-29 NOTE — Therapy (Signed)
OUTPATIENT PHYSICAL THERAPY TREATMENT   Patient Name: Vicki Frazier MRN: [id number] DOB:09/27/1961, 61 y.o., female Today's Date: 06/29/2022   PT End of Session - 06/29/22 0930     Visit Number 9    Number of Visits 16    Date for PT Re-Evaluation 07/27/22    PT Start Time 0928    PT Stop Time 1017    PT Time Calculation (min) 49 min    Activity Tolerance Patient tolerated treatment well    Behavior During Therapy [facility] for tasks assessed/performed                     Past Medical History:  Diagnosis Date   Asthma    Hypertension    Past Surgical History:  Procedure Laterality Date   TOTAL KNEE ARTHROPLASTY Left 05/21/2022   Procedure: LEFT TOTAL KNEE ARTHROPLASTY;  Surgeon: Meredith Pel, MD;  Location: [place];  Service: Orthopedics;  Laterality: Left;   Patient Active Problem List   Diagnosis Date Noted   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   S/P TKR (total knee replacement), left 05/21/2022   Erythema migrans (Lyme disease) 04/27/2022   Hx of healed stress fracture 01/20/2022   FHx: colon cancer 01/20/2022   Arthritis of knee, left 01/20/2022   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   Asthma in adult 11/30/2019   Class 3 severe obesity without serious comorbidity with body mass index (BMI) of 40.0 to 44.9 in adult ([place]) 11/30/2019    PCP: Flossie Buffy, NP  REFERRING PROVIDER: Meredith Pel, MD   REFERRING DIAG: [id number] (ICD-10-CM) - Status post total left knee replacement   THERAPY DIAG:  Acute pain of left knee  Stiffness of left knee, not elsewhere classified  Muscle weakness (generalized)  Unsteadiness on feet  Localized edema  Rationale for Evaluation and Treatment Rehabilitation  ONSET DATE: 05/21/22  SUBJECTIVE:   SUBJECTIVE STATEMENT: Knee is stiff today, had a great day yesterday  PERTINENT HISTORY: HTN and asthma   PAIN:  NPRS scale: 1-2/10 Pain location: Lt knee Pain  description: aching, tightness Aggravating factors: activity, bending knee Relieving factors: ice, lying down, medication  PRECAUTIONS: None  WEIGHT BEARING RESTRICTIONS No  PATIENT GOALS improve mobility and be able to walk dog, regain use of LLE   OBJECTIVE:   PATIENT SURVEYS:  06/01/22: FOTO 30 (predicted 55) 06/29/22: FOTO 55  EDEMA:  06/01/22: Circumferential: joint line - with stocking Lt 49 cm, Rt 41 cm  POSTURE: rounded shoulders and forward head  LOWER EXTREMITY ROM:  Active ROM Right eval Left eval Left 06/08/22 Left 06/11/22 Left 06/15/22 Left 06/22/22 Left 06/25/22  Knee flexion WNL Supine: 82* Supine: 103 Supine:105 Supine: 110 Supine: 111   Knee extension Seated LAQ: 2* hyperext Seated LAQ: -26* Following quad sets: -21* Seated LAQ: -10 Seated LAQ:  -2   Seated LAQ 0 degrees   (Blank rows = not tested)   Passive ROM Right eval Left eval Left 06/08/22 Left 06/15/22  Knee flexion  Supine: 92* end-feel limited by pain Supine: 107 Supine 115  Knee extension  Supine: 0*     (Blank rows = not tested)  LOWER EXTREMITY MMT:  MMT Right eval Left eval Left 06/25/2022  Knee flexion  3-/5 5/5  Knee extension  3-/5 5/5   (Blank rows = not tested)   GAIT: 06/25/2022:  independent ambulation   06/01/22: Distance walked: 100' Assistive device utilized: Environmental consultant - 2 wheeled Level of  assistance: Modified independence Comments: decreased stance on LLE, decreased hip/knee flexion on Lt, mild antalgic gait    TODAY'S TREATMENT: 06/29/22 Therex: Recumbent bike lvl 3, seat 7, 6 mins Leg Press 112 lbs 3 x 10,    LLE only 43 lbs 3 x 10  Forward step ups x 10 reps bil on 6" step, no UE support Lt step downs from 6" step x 15 reps Rockerboard step downs x 10 reps Negotiated stairs for eccentric quad control Knee extension machine 5# with eccentric control focus x 20 reps Sit to/from stand x 10 reps without UE support Lt seated SLR x 10 reps (cues for TKE and to decr  ext lag)  Modalities: Vaso x 10 min; Lt knee mod pressure, 34 deg  06/25/2022 Therex: Recumbent bike lvl 2, seat 7, 6 mins Knee extension machine double leg up, Lt leg lowering 10 lbs 2 x 10  Knee flexion machine 15 lbs 2x10 LLE only Leg Press 112 lbs 2 x 10,    LLE only 43 lbs 2 x 10  Calf raises on incline board 2 x 10 c gastroc stretch 30 sec after each set then 1 extra calf stretch after for 3 total Lateral step down 6 inch step WB on Lt x 15 Step on over and down WB on Lt x 15 4 inch step   Neuro Re-Ed Tandem ambulation on foam beam 6 ft x 6 fwd/back each in //bars Tandem stance on foam 1 min x 2 bilateral c intermittent occasional HHA on bars  06/22/22 Therex: NuStep L6 x 8 min Knee extension machine 5# LLE only 3x10 Knee flexion machine 10# 3x10 LLE only Leg Press 106# 3x10; LLE only 37# 3x10 Calf raises x 20 reps Supine AA heel slides x 15 reps; Lt Seated LAQ 5# Lt; 3x10 Sit fo/from stand x 10 reps without UE support   Neuro Re-Ed Feet together on foam: EO with horizontal/vertical head turns x 10 each Tandem stance on foam 3x30 sec bil; intermittent UE support needed  06/18/22 Therex: NuStep L6 x 8 min Forward step ups onto 6" step x 10 reps bil Lateral step ups onto 6" step x 10 reps bil Leg Press 100# bil 3x10; LLE only 37# 2x10 Sidestepping x 5 laps with L3 band around ankles Backwards walking x 5 laps with L3 band around ankles  Neuro Re-Ed: Rockerboard: Ant/post weight shift; then horizontal head turns; Lateral weight shift Sidestepping on foam x 5 laps in // bars with intermittent UE support Tandem forward/backward on foam with UE support  Modalities: Vaso x 10 min; Lt knee mod pressure, 34 deg   PATIENT EDUCATION:  Education details: [email or web address] Person educated: Patient Education method: Explanation, Demonstration, Tactile cues, Verbal cues, and Handouts Education comprehension: verbalized understanding, returned demonstration, verbal cues required,  and tactile cues required   HOME EXERCISE PROGRAM: Access Code: [email or web address] URL: [email or web address] Date: 06/29/2022 Prepared by: Faustino Congress  Exercises - Supine Heel Slide with Strap  - 5-10 x daily - 7 x weekly - 2-3 sets - 10 reps - Long Sitting Quad Set  - 5-10 x daily - 7 x weekly - 2-3 sets - 10 reps - 5 sec hold - Seated Long Arc Quad  - 5-10 x daily - 7 x weekly - 2-3 sets - 10 reps - 5 sec hold - Seated Knee Flexion AAROM  - 5-10 x daily - 7 x weekly - 2-3 sets - 10 reps - Mini Squat with Counter Support  - 1  x daily - 7 x weekly - 2 sets - 10 reps - Standing Hip Abduction with Counter Support  - 1 x daily - 7 x weekly - 1 sets - 20 reps - Standing Hip Extension with Counter Support  - 1 x daily - 7 x weekly - 1 sets - 20 reps - Heel Raises with Counter Support  - 1 x daily - 7 x weekly - 1 sets - 20 reps - Seated Straight Leg Raise  - 1 x daily - 7 x weekly - 3 sets - 10 reps  ASSESSMENT:  CLINICAL IMPRESSION: Pt has met FOTO goal at this time and is doing well with PT.  Still having difficulty with eccentric quad control and descending stairs, so focused on this today.  Will continue to benefit from PT to maximize function.  OBJECTIVE IMPAIRMENTS Abnormal gait, decreased activity tolerance, decreased balance, decreased knowledge of use of DME, decreased mobility, difficulty walking, decreased ROM, decreased strength, increased edema, and pain.   ACTIVITY LIMITATIONS carrying, lifting, bending, sitting, standing, squatting, sleeping, stairs, transfers, bed mobility, and locomotion level  PARTICIPATION LIMITATIONS: meal prep, cleaning, laundry, driving, community activity, occupation, and yard work  PERSONAL FACTORS 1-2 comorbidities: HTN and asthma   are also affecting patient's functional outcome.   REHAB POTENTIAL: Good  CLINICAL DECISION MAKING: Evolving/moderate complexity  EVALUATION COMPLEXITY: Moderate   GOALS: Goals reviewed with  patient? Yes  SHORT TERM GOALS: Target date: 06/29/2022  Independent with initial HEP Goal status: MET 06/08/22  2.  Lt knee AROM improved to 0-10-95 deg for improved mobility Goal status: MET 06/08/22   LONG TERM GOALS: Target date: 07/27/2022  Independent with final HEP Goal status: on going - assessed 06/25/2022  2.  FOTO score improved to 55 Goal status: MET 06/29/22  3.  Lt knee AROM improved to 0-110 for improved mobility Goal status: Met - assessed 06/25/2022  4.  Report pain < 4/10 with standing and walking activities for improved function, and in preparation for return to work Goal status: on going - assessed 06/25/2022  5.  Amb without AD without significant deviations independently for improved functional mobility Goal status: on going - assessed 06/25/2022    PLAN: PT FREQUENCY: 2x/week  PT DURATION: 8 weeks  PLANNED INTERVENTIONS: Therapeutic exercises, Therapeutic activity, Neuromuscular re-education, Balance training, Gait training, Patient/Family education, Self Care, Joint mobilization, Aquatic Therapy, Dry Needling, Electrical stimulation, Cryotherapy, Moist heat, scar mobilization, Taping, Vasopneumatic device, Manual therapy, and Re-evaluation  PLAN FOR NEXT SESSION:  Stair navigation improvements/strengthening, anticipate d/c next week   Laureen Abrahams, PT, DPT 06/29/22 10:18 AM

## 2022-07-02 ENCOUNTER — Encounter: Payer: Self-pay | Admitting: Physical Therapy

## 2022-07-02 ENCOUNTER — Ambulatory Visit (INDEPENDENT_AMBULATORY_CARE_PROVIDER_SITE_OTHER): Payer: PRIVATE HEALTH INSURANCE | Admitting: Physical Therapy

## 2022-07-02 DIAGNOSIS — M25562 Pain in left knee: Secondary | ICD-10-CM | POA: Diagnosis not present

## 2022-07-02 DIAGNOSIS — R600 Localized edema: Secondary | ICD-10-CM

## 2022-07-02 DIAGNOSIS — R2681 Unsteadiness on feet: Secondary | ICD-10-CM | POA: Diagnosis not present

## 2022-07-02 DIAGNOSIS — M25662 Stiffness of left knee, not elsewhere classified: Secondary | ICD-10-CM

## 2022-07-02 DIAGNOSIS — M6281 Muscle weakness (generalized): Secondary | ICD-10-CM

## 2022-07-02 NOTE — Therapy (Signed)
OUTPATIENT PHYSICAL THERAPY TREATMENT   Patient Name: Abir Eroh MRN: [id number] DOB:1961/01/13, 61 y.o., female Today's Date: 07/02/2022   PT End of Session - 07/02/22 0931     Visit Number 10    Number of Visits 16    Date for PT Re-Evaluation 07/27/22    PT Start Time 0930    PT Stop Time 1009    PT Time Calculation (min) 39 min    Activity Tolerance Patient tolerated treatment well    Behavior During Therapy [facility] for tasks assessed/performed                      Past Medical History:  Diagnosis Date   Asthma    Hypertension    Past Surgical History:  Procedure Laterality Date   TOTAL KNEE ARTHROPLASTY Left 05/21/2022   Procedure: LEFT TOTAL KNEE ARTHROPLASTY;  Surgeon: Meredith Pel, MD;  Location: [place];  Service: Orthopedics;  Laterality: Left;   Patient Active Problem List   Diagnosis Date Noted   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   S/P TKR (total knee replacement), left 05/21/2022   Erythema migrans (Lyme disease) 04/27/2022   Hx of healed stress fracture 01/20/2022   FHx: colon cancer 01/20/2022   Arthritis of knee, left 01/20/2022   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   Asthma in adult 11/30/2019   Class 3 severe obesity without serious comorbidity with body mass index (BMI) of 40.0 to 44.9 in adult ([place]) 11/30/2019    PCP: Flossie Buffy, NP  REFERRING PROVIDER: Meredith Pel, MD   REFERRING DIAG: [phone] (ICD-10-CM) - Status post total left knee replacement   THERAPY DIAG:  Acute pain of left knee  Stiffness of left knee, not elsewhere classified  Muscle weakness (generalized)  Unsteadiness on feet  Localized edema  Rationale for Evaluation and Treatment Rehabilitation  ONSET DATE: 05/21/22  SUBJECTIVE:   SUBJECTIVE STATEMENT: C/O just constant ache and stiffness in knee, but otherwise no difficulties  PERTINENT HISTORY: HTN and asthma   PAIN:  NPRS scale:  3-4/10 Pain location: Lt knee Pain description: aching, tightness Aggravating factors: activity, bending knee Relieving factors: ice, lying down, medication  PRECAUTIONS: None  WEIGHT BEARING RESTRICTIONS No  PATIENT GOALS improve mobility and be able to walk dog, regain use of LLE   OBJECTIVE:   PATIENT SURVEYS:  06/01/22: FOTO 30 (predicted 55) 06/29/22: FOTO 55  EDEMA:  06/01/22: Circumferential: joint line - with stocking Lt 49 cm, Rt 41 cm  POSTURE: rounded shoulders and forward head  LOWER EXTREMITY ROM:  Active ROM Right eval Left eval Left 06/08/22 Left 06/11/22 Left 06/15/22 Left 06/22/22 Left 06/25/22  Knee flexion WNL Supine: 82* Supine: 103 Supine:105 Supine: 110 Supine: 111   Knee extension Seated LAQ: 2* hyperext Seated LAQ: -26* Following quad sets: -21* Seated LAQ: -10 Seated LAQ:  -2   Seated LAQ 0 degrees   (Blank rows = not tested)   Passive ROM Right eval Left eval Left 06/08/22 Left 06/15/22  Knee flexion  Supine: 92* end-feel limited by pain Supine: 107 Supine 115  Knee extension  Supine: 0*     (Blank rows = not tested)  LOWER EXTREMITY MMT:  MMT Right eval Left eval Left 06/25/2022  Knee flexion  3-/5 5/5  Knee extension  3-/5 5/5   (Blank rows = not tested)   GAIT: 06/25/2022:  independent ambulation   06/01/22: Distance walked: 100' Assistive device utilized: Environmental consultant -  2 wheeled Level of assistance: Modified independence Comments: decreased stance on LLE, decreased hip/knee flexion on Lt, mild antalgic gait    TODAY'S TREATMENT: 07/02/22 Therex: NuStep L7 x 8 min Leg Press 112 lbs 3 x 10, LLE only 43 lbs 3 x 10 RDL 10# KB 3x10 Calf raises 10# KB x 20 reps Sidestepping up/down ramp x 5 times each direction LLE on 6" step 2x10; RLE heel taps, intermittent UE support Step up/over with LLE on step 2x10; intermittent UE support TRX squats 2x10 with Rt heel raise for increased LLE weight bearing Standing hip abduction and extension  2x10 with L3 band bil; bil UE support for balance   06/29/22 Therex: Recumbent bike lvl 3, seat 7, 6 mins Leg Press 112 lbs 3 x 10,    LLE only 43 lbs 3 x 10  Forward step ups x 10 reps bil on 6" step, no UE support Lt step downs from 6" step x 15 reps Rockerboard step downs x 10 reps Negotiated stairs for eccentric quad control Knee extension machine 5# with eccentric control focus x 20 reps Sit to/from stand x 10 reps without UE support Lt seated SLR x 10 reps (cues for TKE and to decr ext lag)  Modalities: Vaso x 10 min; Lt knee mod pressure, 34 deg  06/25/2022 Therex: Recumbent bike lvl 2, seat 7, 6 mins Knee extension machine double leg up, Lt leg lowering 10 lbs 2 x 10  Knee flexion machine 15 lbs 2x10 LLE only Leg Press 112 lbs 2 x 10,    LLE only 43 lbs 2 x 10  Calf raises on incline board 2 x 10 c gastroc stretch 30 sec after each set then 1 extra calf stretch after for 3 total Lateral step down 6 inch step WB on Lt x 15 Step on over and down WB on Lt x 15 4 inch step   Neuro Re-Ed Tandem ambulation on foam beam 6 ft x 6 fwd/back each in //bars Tandem stance on foam 1 min x 2 bilateral c intermittent occasional HHA on bars   PATIENT EDUCATION:  Education details: [place] Person educated: Patient Education method: Explanation, Demonstration, Tactile cues, Verbal cues, and Handouts Education comprehension: verbalized understanding, returned demonstration, verbal cues required, and tactile cues required   HOME EXERCISE PROGRAM: Access Code: [email or web address] URL: [email or web address] Date: 07/02/2022 Prepared by: Faustino Congress  Exercises - Supine Heel Slide with Strap  - 5-10 x daily - 7 x weekly - 2-3 sets - 10 reps - Long Sitting Quad Set  - 5-10 x daily - 7 x weekly - 2-3 sets - 10 reps - 5 sec hold - Seated Knee Flexion AAROM  - 5-10 x daily - 7 x weekly - 2-3 sets - 10 reps - Mini Squat with Counter Support  - 1 x daily - 7 x weekly - 2 sets -  10 reps - Seated Straight Leg Raise  - 1 x daily - 7 x weekly - 3 sets - 10 reps - Kettlebell Deadlift  - 1 x daily - 7 x weekly - 2-3 sets - 10 reps - Standing Heel Raise  - 1 x daily - 7 x weekly - 2-3 sets - 10 reps - Standing Hip Abduction with Resistance at Ankles and Counter Support  - 1 x daily - 7 x weekly - 2-3 sets - 10 reps - Standing Hip Extension with Resistance at Ankles and Counter Support  - 1 x daily - 7 x weekly -  2-3 sets - 10 reps  ASSESSMENT:  CLINICAL IMPRESSION: Updated HEP today for home and gym strengthening exercises.  She is nearing d/c at this time.   OBJECTIVE IMPAIRMENTS Abnormal gait, decreased activity tolerance, decreased balance, decreased knowledge of use of DME, decreased mobility, difficulty walking, decreased ROM, decreased strength, increased edema, and pain.   ACTIVITY LIMITATIONS carrying, lifting, bending, sitting, standing, squatting, sleeping, stairs, transfers, bed mobility, and locomotion level  PARTICIPATION LIMITATIONS: meal prep, cleaning, laundry, driving, community activity, occupation, and yard work  PERSONAL FACTORS 1-2 comorbidities: HTN and asthma   are also affecting patient's functional outcome.   REHAB POTENTIAL: Good  CLINICAL DECISION MAKING: Evolving/moderate complexity  EVALUATION COMPLEXITY: Moderate   GOALS: Goals reviewed with patient? Yes  SHORT TERM GOALS: Target date: 06/29/2022  Independent with initial HEP Goal status: MET 06/08/22  2.  Lt knee AROM improved to 0-10-95 deg for improved mobility Goal status: MET 06/08/22   LONG TERM GOALS: Target date: 07/27/2022  Independent with final HEP Goal status: on going - assessed 06/25/2022  2.  FOTO score improved to 55 Goal status: MET 06/29/22  3.  Lt knee AROM improved to 0-110 for improved mobility Goal status: Met - assessed 06/25/2022  4.  Report pain < 4/10 with standing and walking activities for improved function, and in preparation for return to  work Goal status: on going - assessed 06/25/2022  5.  Amb without AD without significant deviations independently for improved functional mobility Goal status: on going - assessed 06/25/2022    PLAN: PT FREQUENCY: 2x/week  PT DURATION: 8 weeks  PLANNED INTERVENTIONS: Therapeutic exercises, Therapeutic activity, Neuromuscular re-education, Balance training, Gait training, Patient/Family education, Self Care, Joint mobilization, Aquatic Therapy, Dry Needling, Electrical stimulation, Cryotherapy, Moist heat, scar mobilization, Taping, Vasopneumatic device, Manual therapy, and Re-evaluation  PLAN FOR NEXT SESSION:  begin checking goals, prep for d/c   Laureen Abrahams, PT, DPT 07/02/22 10:11 AM

## 2022-07-06 ENCOUNTER — Ambulatory Visit (INDEPENDENT_AMBULATORY_CARE_PROVIDER_SITE_OTHER): Payer: PRIVATE HEALTH INSURANCE | Admitting: Rehabilitative and Restorative Service Providers"

## 2022-07-06 ENCOUNTER — Encounter: Payer: Self-pay | Admitting: Rehabilitative and Restorative Service Providers"

## 2022-07-06 DIAGNOSIS — M6281 Muscle weakness (generalized): Secondary | ICD-10-CM | POA: Diagnosis not present

## 2022-07-06 DIAGNOSIS — M25562 Pain in left knee: Secondary | ICD-10-CM | POA: Diagnosis not present

## 2022-07-06 DIAGNOSIS — R2681 Unsteadiness on feet: Secondary | ICD-10-CM | POA: Diagnosis not present

## 2022-07-06 DIAGNOSIS — R600 Localized edema: Secondary | ICD-10-CM

## 2022-07-06 DIAGNOSIS — M25662 Stiffness of left knee, not elsewhere classified: Secondary | ICD-10-CM | POA: Diagnosis not present

## 2022-07-06 NOTE — Therapy (Addendum)
OUTPATIENT PHYSICAL THERAPY TREATMENT /PROGRESS NOTE DISCHARGE SUMMARY   Patient Name: Vicki Frazier MRN: [id number] DOB:Jan 04, 1961, 61 y.o., female Today's Date: 07/06/2022  Progress Note Reporting Period 06/01/2022 to 07/06/2022  See note below for Objective Data and Assessment of Progress/Goals.     END OF SESSION:  PT End of Session - 07/06/22 0945     Visit Number 11    Number of Visits 16    Date for PT Re-Evaluation 07/27/22    PT Start Time 0924    PT Stop Time 1004    PT Time Calculation (min) 40 min    Activity Tolerance Patient tolerated treatment well    Behavior During Therapy [facility] for tasks assessed/performed              Past Medical History:  Diagnosis Date   Asthma    Hypertension    Past Surgical History:  Procedure Laterality Date   TOTAL KNEE ARTHROPLASTY Left 05/21/2022   Procedure: LEFT TOTAL KNEE ARTHROPLASTY;  Surgeon: Meredith Pel, MD;  Location: [place];  Service: Orthopedics;  Laterality: Left;   Patient Active Problem List   Diagnosis Date Noted   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   S/P TKR (total knee replacement), left 05/21/2022   Erythema migrans (Lyme disease) 04/27/2022   Hx of healed stress fracture 01/20/2022   FHx: colon cancer 01/20/2022   Arthritis of knee, left 01/20/2022   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   Asthma in adult 11/30/2019   Class 3 severe obesity without serious comorbidity with body mass index (BMI) of 40.0 to 44.9 in adult ([place]) 11/30/2019    PCP: Flossie Buffy, NP  REFERRING PROVIDER: Meredith Pel, MD   REFERRING DIAG: [phone] (ICD-10-CM) - Status post total left knee replacement   THERAPY DIAG:  Acute pain of left knee  Stiffness of left knee, not elsewhere classified  Muscle weakness (generalized)  Unsteadiness on feet  Localized edema  Rationale for Evaluation and Treatment Rehabilitation  ONSET DATE: 05/21/22  SUBJECTIVE:    SUBJECTIVE STATEMENT: Pt indicated overall improvement around 60 % to normal.  Pt indicated walking exercise still limited at this time due to swelling response.   Pt indicated low level pain upon arrival today.   Pt indicated some hip pain after last visit "something set it off from last visit."  PERTINENT HISTORY: HTN and asthma   PAIN:  NPRS scale: 3/10 Pain location: Lt knee Pain description: aching, tightness Aggravating factors: activity, bending knee Relieving factors: ice, lying down, medication  PRECAUTIONS: None  WEIGHT BEARING RESTRICTIONS No  PATIENT GOALS improve mobility and be able to walk dog, regain use of LLE   OBJECTIVE:   PATIENT SURVEYS:  06/01/22: FOTO 30 (predicted 55) 06/29/22: FOTO 55  EDEMA:  06/01/22: Circumferential: joint line - with stocking Lt 49 cm, Rt 41 cm  POSTURE: rounded shoulders and forward head  LOWER EXTREMITY ROM:  Active ROM Right eval Left eval Left 06/08/22 Left 06/11/22 Left 06/15/22 Left 06/22/22 Left 06/25/22 Left 07/06/2022  Knee flexion WNL Supine: 82* Supine: 103 Supine:105 Supine: 110 Supine: 111  Supine AROM heel slide 111  Knee extension Seated LAQ: 2* hyperext Seated LAQ: -26* Following quad sets: -21* Seated LAQ: -10 Seated LAQ:  -2   Seated LAQ 0 degrees Seated LAQ 0 degrees   (Blank rows = not tested)   Passive ROM Right eval Left eval Left 06/08/22 Left 06/15/22  Knee flexion  Supine: 92* end-feel limited  by pain Supine: 107 Supine 115  Knee extension  Supine: 0*     (Blank rows = not tested)  LOWER EXTREMITY MMT:  MMT Right eval Left eval Left 06/25/2022 Left 07/06/2022 Right 07/06/2022  Knee flexion  3-/5 5/5    Knee extension  3-/5 5/5 5/5 55, 53 lbs 5/5 54.4 lbs   (Blank rows = not tested)   GAIT: 06/25/2022:  independent ambulation   06/01/22: Distance walked: 100' Assistive device utilized: Environmental consultant - 2 wheeled Level of assistance: Modified independence Comments: decreased stance on LLE,  decreased hip/knee flexion on Lt, mild antalgic gait    TODAY'S TREATMENT: 07/06/22 Therex: Recumbent bike Lvl 3 8 mins, seat 7 Knee extension double leg up, Lt leg lowering 10 lbs 2 x 10 (cues for gym use) RDL - held due to Pt discussion about hip complaints from last visit Calf raises 10# KB x 20 reps Incline calf stretch bilateral 30 sec x 3 Standing hip abduction and extension c band (held today due to complaints from last visit)  Verbal and visual review of HEP with handout viewing  TherActivity:  Step on over and down 6 inch step single hand assist WB on Lt leg x 15  Leg press 43 lbs 2 x 15 Lt leg    07/02/22 Therex: NuStep L7 x 8 min Leg Press 112 lbs 3 x 10, LLE only 43 lbs 3 x 10 RDL 10# KB 3x10 Calf raises 10# KB x 20 reps Sidestepping up/down ramp x 5 times each direction LLE on 6" step 2x10; RLE heel taps, intermittent UE support Step up/over with LLE on step 2x10; intermittent UE support TRX squats 2x10 with Rt heel raise for increased LLE weight bearing Standing hip abduction and extension 2x10 with L3 band bil; bil UE support for balance   06/29/22 Therex: Recumbent bike lvl 3, seat 7, 6 mins Leg Press 112 lbs 3 x 10,    LLE only 43 lbs 3 x 10  Forward step ups x 10 reps bil on 6" step, no UE support Lt step downs from 6" step x 15 reps Rockerboard step downs x 10 reps Negotiated stairs for eccentric quad control Knee extension machine 5# with eccentric control focus x 20 reps Sit to/from stand x 10 reps without UE support Lt seated SLR x 10 reps (cues for TKE and to decr ext lag)  Modalities: Vaso x 10 min; Lt knee mod pressure, 34 deg   PATIENT EDUCATION:  Education details: [id number] Person educated: Patient Education method: Explanation, Demonstration, Tactile cues, Verbal cues, and Handouts Education comprehension: verbalized understanding, returned demonstration, verbal cues required, and tactile cues required   HOME EXERCISE PROGRAM: Access  Code: [email or web address] URL: [email or web address] Date: 07/02/2022 Prepared by: Faustino Congress  Exercises - Supine Heel Slide with Strap  - 5-10 x daily - 7 x weekly - 2-3 sets - 10 reps - Long Sitting Quad Set  - 5-10 x daily - 7 x weekly - 2-3 sets - 10 reps - 5 sec hold - Seated Knee Flexion AAROM  - 5-10 x daily - 7 x weekly - 2-3 sets - 10 reps - Mini Squat with Counter Support  - 1 x daily - 7 x weekly - 2 sets - 10 reps - Seated Straight Leg Raise  - 1 x daily - 7 x weekly - 3 sets - 10 reps - Kettlebell Deadlift  - 1 x daily - 7 x weekly - 2-3 sets - 10 reps - Standing Heel  Raise  - 1 x daily - 7 x weekly - 2-3 sets - 10 reps - Standing Hip Abduction with Resistance at Ankles and Counter Support  - 1 x daily - 7 x weekly - 2-3 sets - 10 reps - Standing Hip Extension with Resistance at Ankles and Counter Support  - 1 x daily - 7 x weekly - 2-3 sets - 10 reps  ASSESSMENT:  CLINICAL IMPRESSION: Pt has attended 11 visits overall during course of treatment.  See objective data for updated information regarding current presentation.  Pt has demonstrated good progress towards goals at this time.  Mild complaints of pain still noted at times as well as functional limitation in walking distances due to fatigue/swelling response.  Pt has demonstrated good knowledge of HEP at this time and has access to gym based equipment.  Return to MD for follow up and possible transitioning towards HEP based off collective dicussion.    OBJECTIVE IMPAIRMENTS Abnormal gait, decreased activity tolerance, decreased balance, decreased knowledge of use of DME, decreased mobility, difficulty walking, decreased ROM, decreased strength, increased edema, and pain.   ACTIVITY LIMITATIONS carrying, lifting, bending, sitting, standing, squatting, sleeping, stairs, transfers, bed mobility, and locomotion level  PARTICIPATION LIMITATIONS: meal prep, cleaning, laundry, driving, community activity, occupation,  and yard work  PERSONAL FACTORS 1-2 comorbidities: HTN and asthma   are also affecting patient's functional outcome.   REHAB POTENTIAL: Good  CLINICAL DECISION MAKING: Evolving/moderate complexity  EVALUATION COMPLEXITY: Moderate   GOALS: Goals reviewed with patient? Yes  SHORT TERM GOALS: Target date: 06/29/2022  Independent with initial HEP Goal status: MET 06/08/22  2.  Lt knee AROM improved to 0-10-95 deg for improved mobility Goal status: MET 06/08/22   LONG TERM GOALS: Target date: 07/27/2022  Independent with final HEP Goal status: MET - assessed 07/06/2022  2.  FOTO score improved to 55 Goal status: MET 06/29/22  3.  Lt knee AROM improved to 0-110 for improved mobility Goal status: Met - assessed 06/25/2022  4.  Report pain < 4/10 with standing and walking activities for improved function, and in preparation for return to work Goal status: MET - assessed 07/06/2022  5.  Amb without AD without significant deviations independently for improved functional mobility Goal status: MET - assessed 07/06/2022    PLAN: PT FREQUENCY: 2x/week  PT DURATION: 8 weeks  PLANNED INTERVENTIONS: Therapeutic exercises, Therapeutic activity, Neuromuscular re-education, Balance training, Gait training, Patient/Family education, Self Care, Joint mobilization, Aquatic Therapy, Dry Needling, Electrical stimulation, Cryotherapy, Moist heat, scar mobilization, Taping, Vasopneumatic device, Manual therapy, and Re-evaluation  PLAN FOR NEXT SESSION:  Return to MD for follow up, trial HEP   Scot Jun, PT, DPT, OCS, ATC 07/06/22  10:09 AM     PHYSICAL THERAPY DISCHARGE SUMMARY  Visits from Start of Care: 11  Current functional level related to goals / functional outcomes: See above   Remaining deficits: See above   Education / Equipment: HEP   Patient agrees to discharge. Patient goals were met. Patient is being discharged due to meeting the stated rehab goals.   Laureen Abrahams, PT, DPT 09/08/22 9:02 AM  [facility] [address] [place], [place], [address] Phone: [phone]   Fax:  [phone]

## 2022-07-09 ENCOUNTER — Ambulatory Visit (INDEPENDENT_AMBULATORY_CARE_PROVIDER_SITE_OTHER): Payer: PRIVATE HEALTH INSURANCE | Admitting: Surgical

## 2022-07-09 ENCOUNTER — Encounter: Payer: Self-pay | Admitting: Orthopedic Surgery

## 2022-07-09 ENCOUNTER — Encounter: Payer: PRIVATE HEALTH INSURANCE | Admitting: Physical Therapy

## 2022-07-09 DIAGNOSIS — Z96652 Presence of left artificial knee joint: Secondary | ICD-10-CM

## 2022-07-09 NOTE — Progress Notes (Signed)
Post-Op Visit Note   Patient: Vicki Frazier           Date of Birth: [birth year]           MRN: [id number] Visit Date: 07/09/2022 PCP: Flossie Buffy, NP   Assessment & Plan:  Chief Complaint:  Chief Complaint  Patient presents with   Left Knee - Routine Post Op    05/21/22 Left TKA   Visit Diagnoses: No diagnosis found.  Plan: Patient is a 61 year old female who presents s/p left total knee arthroplasty on 05/21/2022.  Doing pretty well overall.  Finished physical therapy this past Monday.  She does complain of aching pain most days it is correlated with her activity level but this pain no longer keeps her up at night or wakes her from sleep.  She takes Tylenol only as needed.  No fevers or chills or change in the appearance of her incision.  She has finished physical therapy and is now transitioning to doing a home exercise program at the [facility] which consist of riding the exercise bike and doing some strength training exercises primarily.  She enjoys walking with her dog and can she can walk half a mile twice per day now.  Mild difficulty with descending stairs.  No difficulty with a sending stairs or getting up from a toilet seat.  On exam, patient has 0 degrees extension and 1 to 5 degrees of knee flexion.  Small effusion present.  Incision is well-healed without evidence of infection or dehiscence.  No calf tenderness.  Negative Homans' sign.  She is able to perform straight leg raise with excellent quad strength rated 5/5.  No significant laxity to varus or valgus stress.  She ambulates without significant antalgia.  Plan is continue with home exercise program.  Follow-up in 6 weeks for clinical recheck.  Counseled her on antibiotic dental prophylaxis.  Final check in 6 weeks with Dr. Marlou Sa.  Follow-Up Instructions: No follow-ups on file.   Orders:  No orders of the defined types were placed in this encounter.  No orders of the defined types were placed in this  encounter.   Imaging: No results found.  PMFS History: Patient Active Problem List   Diagnosis Date Noted   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   S/P TKR (total knee replacement), left 05/21/2022   Erythema migrans (Lyme disease) 04/27/2022   Hx of healed stress fracture 01/20/2022   FHx: colon cancer 01/20/2022   Arthritis of knee, left 01/20/2022   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   Asthma in adult 11/30/2019   Class 3 severe obesity without serious comorbidity with body mass index (BMI) of 40.0 to 44.9 in adult [facility]) 11/30/2019   Past Medical History:  Diagnosis Date   Asthma    Hypertension     Family History  Problem Relation Age of Onset   Cancer Father 25       bladder   Cancer Sister 17       melanoma   Obesity Sister    Heart disease Sister    Cancer Maternal Grandmother 80       colon cancer    Past Surgical History:  Procedure Laterality Date   TOTAL KNEE ARTHROPLASTY Left 05/21/2022   Procedure: LEFT TOTAL KNEE ARTHROPLASTY;  Surgeon: Meredith Pel, MD;  Location: [place];  Service: Orthopedics;  Laterality: Left;   Social History   Occupational History   Not on file  Tobacco Use  Smoking status: Never   Smokeless tobacco: Never  Vaping Use   Vaping Use: Never used  Substance and Sexual Activity   Alcohol use: Never   Drug use: Never   Sexual activity: Not on file

## 2022-07-16 ENCOUNTER — Telehealth: Payer: Self-pay | Admitting: Orthopedic Surgery

## 2022-07-16 NOTE — Telephone Encounter (Signed)
Patient called. Says she is having a lot of pain and feels a click. Would like someone to call her. [phone]

## 2022-07-16 NOTE — Telephone Encounter (Signed)
Patient returned call asked for a call back. The number to contact patient is [phone]

## 2022-07-16 NOTE — Telephone Encounter (Signed)
Tried calling-no answer. LMVM advising to return call

## 2022-07-17 ENCOUNTER — Telehealth: Payer: Self-pay | Admitting: Orthopedic Surgery

## 2022-07-17 NOTE — Telephone Encounter (Signed)
Pt returned call to [place] asked for a return call at [phone].

## 2022-07-17 NOTE — Telephone Encounter (Signed)
IC talked with patient. She is having some new symptoms that need to be addressed. Patient scheduled to see Dr Marlou Sa Wednesday at 930 a.m.

## 2022-07-17 NOTE — Telephone Encounter (Signed)
Tried calling patient back. No answer. Went to VM. LMVM advising was returning her call.

## 2022-07-17 NOTE — Telephone Encounter (Signed)
See other note. This has been addressed. 

## 2022-07-17 NOTE — Telephone Encounter (Signed)
See other notes

## 2022-07-17 NOTE — Telephone Encounter (Signed)
Pt returned call to [place] asked for a return call at [phone].

## 2022-07-21 ENCOUNTER — Encounter: Payer: Self-pay | Admitting: Nurse Practitioner

## 2022-07-21 ENCOUNTER — Ambulatory Visit (INDEPENDENT_AMBULATORY_CARE_PROVIDER_SITE_OTHER): Payer: PRIVATE HEALTH INSURANCE | Admitting: Nurse Practitioner

## 2022-07-21 VITALS — BP 118/70 | HR 62 | Temp 97.1°F | Ht 67.0 in | Wt 232.6 lb

## 2022-07-21 DIAGNOSIS — R7303 Prediabetes: Secondary | ICD-10-CM | POA: Diagnosis not present

## 2022-07-21 DIAGNOSIS — Z0001 Encounter for general adult medical examination with abnormal findings: Secondary | ICD-10-CM | POA: Diagnosis not present

## 2022-07-21 DIAGNOSIS — E7800 Pure hypercholesterolemia, unspecified: Secondary | ICD-10-CM

## 2022-07-21 DIAGNOSIS — Z23 Encounter for immunization: Secondary | ICD-10-CM | POA: Diagnosis not present

## 2022-07-21 DIAGNOSIS — Z808 Family history of malignant neoplasm of other organs or systems: Secondary | ICD-10-CM | POA: Insufficient documentation

## 2022-07-21 DIAGNOSIS — Z8052 Family history of malignant neoplasm of bladder: Secondary | ICD-10-CM | POA: Insufficient documentation

## 2022-07-21 DIAGNOSIS — E559 Vitamin D deficiency, unspecified: Secondary | ICD-10-CM | POA: Diagnosis not present

## 2022-07-21 DIAGNOSIS — Z6836 Body mass index (BMI) 36.0-36.9, adult: Secondary | ICD-10-CM | POA: Diagnosis not present

## 2022-07-21 LAB — COMPREHENSIVE METABOLIC PANEL
ALT: 14 U/L (ref 0–35)
AST: 22 U/L (ref 0–37)
Albumin: 4.4 g/dL (ref 3.5–5.2)
Alkaline Phosphatase: 95 U/L (ref 39–117)
BUN: 20 mg/dL (ref 6–23)
CO2: 29 mEq/L (ref 19–32)
Calcium: 10.1 mg/dL (ref 8.4–10.5)
Chloride: 105 mEq/L (ref 96–112)
Creatinine, Ser: 0.85 mg/dL (ref 0.40–1.20)
GFR: 74.27 mL/min (ref >60.00)
Glucose, Bld: 96 mg/dL (ref 70–99)
Potassium: 4.5 mEq/L (ref 3.5–5.1)
Sodium: 142 mEq/L (ref 135–145)
Total Bilirubin: 0.5 mg/dL (ref 0.2–1.2)
Total Protein: 7.8 g/dL (ref 6.0–8.3)

## 2022-07-21 LAB — LIPID PANEL
Cholesterol: 210 mg/dL — ABNORMAL HIGH (ref 0–200)
HDL: 72.7 mg/dL (ref >39.00)
LDL Cholesterol: 121 mg/dL — ABNORMAL HIGH (ref 0–99)
NonHDL: 136.92
Total CHOL/HDL Ratio: 3
Triglycerides: 80 mg/dL (ref 0.0–149.0)
VLDL: 16 mg/dL (ref 0.0–40.0)

## 2022-07-21 LAB — CBC
HCT: 39.6 % (ref 36.0–46.0)
Hemoglobin: 13.2 g/dL (ref 12.0–15.0)
MCHC: 33.3 g/dL (ref 30.0–36.0)
MCV: 90.7 fl (ref 78.0–100.0)
Platelets: 292 10*3/uL (ref 150.0–400.0)
RBC: 4.37 Mil/uL (ref 3.87–5.11)
RDW: 14.8 % (ref 11.5–15.5)
WBC: 6.6 10*3/uL (ref 4.0–10.5)

## 2022-07-21 LAB — HEMOGLOBIN A1C: Hgb A1c MFr Bld: 5.6 % (ref 4.6–6.5)

## 2022-07-21 LAB — VITAMIN D 25 HYDROXY (VIT D DEFICIENCY, FRACTURES): VITD: 44.87 ng/mL (ref 30.00–100.00)

## 2022-07-21 MED ORDER — SAXENDA 18 MG/3ML ~~LOC~~ SOPN: 0.6000 mg | PEN_INJECTOR | Freq: Every day | SUBCUTANEOUS | 0 refills | Status: DC

## 2022-07-21 NOTE — Assessment & Plan Note (Signed)
Difficulty with maintain healthy diet and limited exercise due to chronic bilateral knee pain secondary to OA. Admits to stress and boredom eating. Lowest weight:165lbs 13yr ago Highest weight: 300lbs 136yrago. Weight loss attempts in past: small meal portions, weight watcher diet, Lost about 20lbs most, this was 5years ago No previous nutritionist appt or weight loss program Not interest in any surgical intervention. Current Exercise regimen: walking, stationery bike, weight training 4x/week. Wt Readings from Last 3 Encounters:  07/21/22 232 lb 9.6 oz (105.5 kg)  05/14/22 234 lb 1.6 oz (106.2 kg)  04/27/22 236 lb 12.8 oz (107.4 kg)   We discussed use of phentermine vs qsymia vs contrave vs glp-1 injection. She opted to use saxenda injection due to lack of insurance coverage for wegovy. Advised about possible side effects and med contraindications. Saxenda sent F/up in 4weeks

## 2022-07-21 NOTE — Patient Instructions (Addendum)
Contact [facility] for repeat colonoscopy: [phone].  Go to lab  you will be contacted to schedule appt with nutritionist.  Calorie Counting for Weight Loss Calories are units of energy. Your body needs a certain number of calories from food to keep going throughout the day. When you eat or drink more calories than your body needs, your body stores the extra calories mostly as fat. When you eat or drink fewer calories than your body needs, your body burns fat to get the energy it needs. Calorie counting means keeping track of how many calories you eat and drink each day. Calorie counting can be helpful if you need to lose weight. If you eat fewer calories than your body needs, you should lose weight. Ask your health care provider what a healthy weight is for you. For calorie counting to work, you will need to eat the right number of calories each day to lose a healthy amount of weight per week. A dietitian can help you figure out how many calories you need in a day and will suggest ways to reach your calorie goal. A healthy amount of weight to lose each week is usually 1-2 lb (0.5-0.9 kg). This usually means that your daily calorie intake should be reduced by 500-750 calories. Eating 1,200-1,500 calories a day can help most women lose weight. Eating 1,500-1,800 calories a day can help most men lose weight. What do I need to know about calorie counting? Work with your health care provider or dietitian to determine how many calories you should get each day. To meet your daily calorie goal, you will need to: Find out how many calories are in each food that you would like to eat. Try to do this before you eat. Decide how much of the food you plan to eat. Keep a food log. Do this by writing down what you ate and how many calories it had. To successfully lose weight, it is important to balance calorie counting with a healthy lifestyle that includes regular activity. Where do I find calorie  information?  The number of calories in a food can be found on a Nutrition Facts label. If a food does not have a Nutrition Facts label, try to look up the calories online or ask your dietitian for help. Remember that calories are listed per serving. If you choose to have more than one serving of a food, you will have to multiply the calories per serving by the number of servings you plan to eat. For example, the label on a package of bread might say that a serving size is 1 slice and that there are 90 calories in a serving. If you eat 1 slice, you will have eaten 90 calories. If you eat 2 slices, you will have eaten 180 calories. How do I keep a food log? After each time that you eat, record the following in your food log as soon as possible: What you ate. Be sure to include toppings, sauces, and other extras on the food. How much you ate. This can be measured in cups, ounces, or number of items. How many calories were in each food and drink. The total number of calories in the food you ate. Keep your food log near you, such as in a pocket-sized notebook or on an app or website on your mobile phone. Some programs will calculate calories for you and show you how many calories you have left to meet your daily goal. What are some  portion-control tips? Know how many calories are in a serving. This will help you know how many servings you can have of a certain food. Use a measuring cup to measure serving sizes. You could also try weighing out portions on a kitchen scale. With time, you will be able to estimate serving sizes for some foods. Take time to put servings of different foods on your favorite plates or in your favorite bowls and cups so you know what a serving looks like. Try not to eat straight from a food's packaging, such as from a bag or box. Eating straight from the package makes it hard to see how much you are eating and can lead to overeating. Put the amount you would like to eat in a cup  or on a plate to make sure you are eating the right portion. Use smaller plates, glasses, and bowls for smaller portions and to prevent overeating. Try not to multitask. For example, avoid watching TV or using your computer while eating. If it is time to eat, sit down at a table and enjoy your food. This will help you recognize when you are full. It will also help you be more mindful of what and how much you are eating. What are tips for following this plan? Reading food labels Check the calorie count compared with the serving size. The serving size may be smaller than what you are used to eating. Check the source of the calories. Try to choose foods that are high in protein, fiber, and vitamins, and low in saturated fat, trans fat, and sodium. Shopping Read nutrition labels while you shop. This will help you make healthy decisions about which foods to buy. Pay attention to nutrition labels for low-fat or fat-free foods. These foods sometimes have the same number of calories or more calories than the full-fat versions. They also often have added sugar, starch, or salt to make up for flavor that was removed with the fat. Make a grocery list of lower-calorie foods and stick to it. Cooking Try to cook your favorite foods in a healthier way. For example, try baking instead of frying. Use low-fat dairy products. Meal planning Use more fruits and vegetables. One-half of your plate should be fruits and vegetables. Include lean proteins, such as chicken, [place], and fish. Lifestyle Each week, aim to do one of the following: 150 minutes of moderate exercise, such as walking. 75 minutes of vigorous exercise, such as running. General information Know how many calories are in the foods you eat most often. This will help you calculate calorie counts faster. Find a way of tracking calories that works for you. Get creative. Try different apps or programs if writing down calories does not work for you. What  foods should I eat?  Eat nutritious foods. It is better to have a nutritious, high-calorie food, such as an avocado, than a food with few nutrients, such as a bag of potato chips. Use your calories on foods and drinks that will fill you up and will not leave you hungry soon after eating. Examples of foods that fill you up are nuts and nut butters, vegetables, lean proteins, and high-fiber foods such as whole grains. High-fiber foods are foods with more than 5 g of fiber per serving. Pay attention to calories in drinks. Low-calorie drinks include water and unsweetened drinks. The items listed above may not be a complete list of foods and beverages you can eat. Contact a dietitian for more information. What foods should  I limit? Limit foods or drinks that are not good sources of vitamins, minerals, or protein or that are high in unhealthy fats. These include: Candy. Other sweets. Sodas, specialty coffee drinks, alcohol, and juice. The items listed above may not be a complete list of foods and beverages you should avoid. Contact a dietitian for more information. How do I count calories when eating out? Pay attention to portions. Often, portions are much larger when eating out. Try these tips to keep portions smaller: Consider sharing a meal instead of getting your own. If you get your own meal, eat only half of it. Before you start eating, ask for a container and put half of your meal into it. When available, consider ordering smaller portions from the menu instead of full portions. Pay attention to your food and drink choices. Knowing the way food is cooked and what is included with the meal can help you eat fewer calories. If calories are listed on the menu, choose the lower-calorie options. Choose dishes that include vegetables, fruits, whole grains, low-fat dairy products, and lean proteins. Choose items that are boiled, broiled, grilled, or steamed. Avoid items that are buttered, battered,  fried, or served with cream sauce. Items labeled as crispy are usually fried, unless stated otherwise. Choose water, low-fat milk, unsweetened iced tea, or other drinks without added sugar. If you want an alcoholic beverage, choose a lower-calorie option, such as a glass of wine or light beer. Ask for dressings, sauces, and syrups on the side. These are usually high in calories, so you should limit the amount you eat. If you want a salad, choose a garden salad and ask for grilled meats. Avoid extra toppings such as bacon, cheese, or fried items. Ask for the dressing on the side, or ask for olive oil and vinegar or lemon to use as dressing. Estimate how many servings of a food you are given. Knowing serving sizes will help you be aware of how much food you are eating at restaurants. Where to find more information Centers for Disease Control and Prevention: http://www.wolf.info/ U.S. Department of Agriculture: http://www.wilson-mendoza.org/ Summary Calorie counting means keeping track of how many calories you eat and drink each day. If you eat fewer calories than your body needs, you should lose weight. A healthy amount of weight to lose per week is usually 1-2 lb (0.5-0.9 kg). This usually means reducing your daily calorie intake by 500-750 calories. The number of calories in a food can be found on a Nutrition Facts label. If a food does not have a Nutrition Facts label, try to look up the calories online or ask your dietitian for help. Use smaller plates, glasses, and bowls for smaller portions and to prevent overeating. Use your calories on foods and drinks that will fill you up and not leave you hungry shortly after a meal. This information is not intended to replace advice given to you by your health care provider. Make sure you discuss any questions you have with your health care provider. Document Revised: 12/07/2019 Document Reviewed: 12/07/2019 Elsevier Patient Education  [place].

## 2022-07-21 NOTE — Progress Notes (Signed)
Complete physical exam  Patient: Vicki Frazier   DOB: 02/23/1961   61 y.o. Female  MRN: [id number] Visit Date: 07/21/2022  Subjective:    Chief Complaint  Patient presents with   Annual Exam    CPE Pt fasting  Weight loss options  Flu vaccine given today    Vicki Frazier is a 61 y.o. female who presents today for a complete physical exam. She reports consuming a general diet. Gym/ health club routine includes cardio, light weights, and low impact aerobics. She generally feels well. She reports sleeping fairly well. She does have additional problems to discuss today.  Vision:Yes Dental:Yes STD Screen:No  Most recent fall risk assessment:    07/21/2022    8:49 AM  Fall Risk   Falls in the past year? 0  Number falls in past yr: 0  Injury with Fall? 0   Most recent depression screenings:    07/21/2022    9:34 AM 04/27/2022    2:03 PM  PHQ 2/9 Scores  PHQ - 2 Score 2 0  PHQ- 9 Score 6     HPI  Obesity Difficulty with maintain healthy diet and limited exercise due to chronic bilateral knee pain secondary to OA. Admits to stress and boredom eating. Lowest weight:165lbs 40yr ago Highest weight: 300lbs 132yrago. Weight loss attempts in past: small meal portions, weight watcher diet, Lost about 20lbs most, this was 5years ago No previous nutritionist appt or weight loss program Not interest in any surgical intervention. Current Exercise regimen: walking, stationery bike, weight training 4x/week. Wt Readings from Last 3 Encounters:  07/21/22 232 lb 9.6 oz (105.5 kg)  05/14/22 234 lb 1.6 oz (106.2 kg)  04/27/22 236 lb 12.8 oz (107.4 kg)   We discussed use of phentermine vs qsymia vs contrave vs glp-1 injection. She opted to use saxenda injection due to lack of insurance coverage for wegovy. Advised about possible side effects and med contraindications. Saxenda sent F/up in 4weeks   Past Medical History:  Diagnosis Date   Asthma    Hypertension    Past Surgical  History:  Procedure Laterality Date   TOTAL KNEE ARTHROPLASTY Left 05/21/2022   Procedure: LEFT TOTAL KNEE ARTHROPLASTY;  Surgeon: DeMeredith PelMD;  [facility] Service: Orthopedics;  Laterality: Left;   Social History   Socioeconomic History   Marital status: Single    Spouse name: Not on file   Number of children: Not on file   Years of education: Not on file   Highest education level: Not on file  Occupational History   Not on file  Tobacco Use   Smoking status: Never   Smokeless tobacco: Never  Vaping Use   Vaping Use: Never used  Substance and Sexual Activity   Alcohol use: Never   Drug use: Never   Sexual activity: Not on file  Other Topics Concern   Not on file  Social History Narrative   Not on file   Social Determinants of Health   Financial Resource Strain: Not on file  Food Insecurity: Not on file  Transportation Needs: Not on file  Physical Activity: Not on file  Stress: Not on file  Social Connections: Not on file  Intimate Partner Violence: Not on file   Family Status  Relation Name Status   Mother  Deceased   Father  Deceased   Sister  Deceased   MGM  Deceased   Family History  Problem Relation Age of Onset   Cancer  Father 58       bladder   Cancer Sister 16       melanoma   Obesity Sister    Heart disease Sister    Cancer Maternal Grandmother 52       colon cancer   Allergies  Allergen Reactions   Molds & Smuts     Other reaction(s): Eye Redness   Grass Pollen(K-O-R-T-Swt Vern) Other (See Comments)    Itchy eyes   Other Itching    Animal dander Runny nose itchy eyes   Peanut Allergen Powder-Dnfp Itching    Runny nose itchy eyes    Patient Care Team: Lanyla Costello, Charlene Brooke, NP as PCP - General (Internal Medicine) Imaging, [facility] (Diagnostic Radiology)   Medications: Outpatient Medications Prior to Visit  Medication Sig   albuterol (VENTOLIN HFA) 108 (90 Base) MCG/ACT inhaler Inhale 2 puffs  into the lungs every 6 (six) hours as needed for wheezing or shortness of breath. (Patient taking differently: Inhale 2 puffs into the lungs every 6 (six) hours as needed (Asthma).)   Calcium Carb-Cholecalciferol (CALCIUM 600+D) 600-20 MG-MCG TABS Take 1 tablet by mouth daily.   cetirizine (ZYRTEC) 10 MG tablet Take 10 mg by mouth daily.   Cholecalciferol (VITAMIN D3) 50 MCG (2000 UT) CAPS Take 2,000 Units by mouth 2 (two) times daily.   fluticasone (FLONASE) 50 MCG/ACT nasal spray Place 1 spray into both nostrils daily.   Multiple Vitamin (MULTI-VITAMIN) tablet Take 1 tablet by mouth daily. Woman   [DISCONTINUED] docusate sodium (COLACE) 100 MG capsule Take 1 capsule (100 mg total) by mouth 2 (two) times daily.   [DISCONTINUED] doxycycline (VIBRA-TABS) 100 MG tablet Take 1 tablet (100 mg total) by mouth 2 (two) times daily.   [DISCONTINUED] HYDROcodone-acetaminophen (NORCO/VICODIN) 5-325 MG tablet Take 1 tablet by mouth every 6 (six) hours as needed for moderate pain.   [DISCONTINUED] HYDROcodone-acetaminophen (NORCO/VICODIN) 5-325 MG tablet Take 1 tablet by mouth every 6 (six) hours as needed for moderate pain.   [DISCONTINUED] hydrOXYzine (ATARAX) 50 MG tablet Take 50 mg by mouth daily as needed for itching.   [DISCONTINUED] Lactobacillus Rhamnosus, GG, (CULTURELLE) CAPS Take 1 capsule by mouth daily.   [DISCONTINUED] methocarbamol (ROBAXIN) 500 MG tablet Take 1 tablet (500 mg total) by mouth every 8 (eight) hours as needed for muscle spasms.   [DISCONTINUED] methocarbamol (ROBAXIN) 500 MG tablet Take 1 tablet (500 mg total) by mouth 4 (four) times daily.   [DISCONTINUED] mupirocin ointment (BACTROBAN) 2 % Apply 1 Application topically 3 (three) times daily.   [DISCONTINUED] rivaroxaban (XARELTO) 10 MG TABS tablet Take 1 tablet (10 mg total) by mouth daily.   No facility-administered medications prior to visit.    Review of Systems  Constitutional:  Negative for fever.  HENT:  Negative for  congestion and sore throat.   Eyes:        Negative for visual changes  Respiratory:  Negative for cough and shortness of breath.   Cardiovascular:  Negative for chest pain, palpitations and leg swelling.  Gastrointestinal:  Negative for blood in stool, constipation and diarrhea.  Genitourinary:  Negative for dysuria, frequency and urgency.  Musculoskeletal:  Negative for myalgias.  Skin:  Negative for rash.  Neurological:  Negative for dizziness and headaches.  Hematological:  Does not bruise/bleed easily.  Psychiatric/Behavioral:  Negative for suicidal ideas. The patient is not nervous/anxious.         Objective:  BP 118/70   Pulse 62   Temp (!)  97.1 F (36.2 C) (Temporal)   Ht '5\' 7"'$  (1.702 m)   Wt 232 lb 9.6 oz (105.5 kg)   SpO2 98%   BMI 36.43 kg/m     BP Readings from Last 3 Encounters:  07/21/22 118/70  05/22/22 (!) 113/56  05/14/22 122/64   Wt Readings from Last 3 Encounters:  07/21/22 232 lb 9.6 oz (105.5 kg)  05/14/22 234 lb 1.6 oz (106.2 kg)  04/27/22 236 lb 12.8 oz (107.4 kg)   Physical Exam Constitutional:      General: She is not in acute distress. HENT:     Right Ear: External ear normal.     Left Ear: External ear normal.     Nose: Nose normal.     Mouth/Throat:     Pharynx: No oropharyngeal exudate.  Eyes:     General: No scleral icterus. Cardiovascular:     Rate and Rhythm: Normal rate and regular rhythm.     Heart sounds: Normal heart sounds.  Pulmonary:     Effort: Pulmonary effort is normal. No respiratory distress.     Breath sounds: Normal breath sounds.  Abdominal:     General: There is no distension.     Palpations: Abdomen is soft.  Musculoskeletal:        General: Normal range of motion.     Cervical back: Normal range of motion and neck supple.  Lymphadenopathy:     Cervical: No cervical adenopathy.  Skin:    General: Skin is warm and dry.  Neurological:     Mental Status: She is alert and oriented to person, place, and  time.  Psychiatric:        Behavior: Behavior normal.      Results for orders placed or performed in visit on 07/21/22  CBC  Result Value Ref Range   WBC 6.6 4.0 - 10.5 K/uL   RBC 4.37 3.87 - 5.11 Mil/uL   Platelets 292.0 150.0 - 400.0 K/uL   Hemoglobin 13.2 12.0 - 15.0 g/dL   HCT 39.6 36.0 - 46.0 %   MCV 90.7 78.0 - 100.0 fl   MCHC 33.3 30.0 - 36.0 g/dL   RDW 14.8 11.5 - 15.5 %  Comprehensive metabolic panel  Result Value Ref Range   Sodium 142 135 - 145 mEq/L   Potassium 4.5 3.5 - 5.1 mEq/L   Chloride 105 96 - 112 mEq/L   CO2 29 19 - 32 mEq/L   Glucose, Bld 96 70 - 99 mg/dL   BUN 20 6 - 23 mg/dL   Creatinine, Ser 0.85 0.40 - 1.20 mg/dL   Total Bilirubin 0.5 0.2 - 1.2 mg/dL   Alkaline Phosphatase 95 39 - 117 U/L   AST 22 0 - 37 U/L   ALT 14 0 - 35 U/L   Total Protein 7.8 6.0 - 8.3 g/dL   Albumin 4.4 3.5 - 5.2 g/dL   GFR 74.27 >60.00 mL/min   Calcium 10.1 8.4 - 10.5 mg/dL  VITAMIN D 25 Hydroxy (Vit-D Deficiency, Fractures)  Result Value Ref Range   VITD 44.87 30.00 - 100.00 ng/mL  Lipid panel  Result Value Ref Range   Cholesterol 210 (H) 0 - 200 mg/dL   Triglycerides 80.0 0.0 - 149.0 mg/dL   HDL 72.70 >39.00 mg/dL   VLDL 16.0 0.0 - 40.0 mg/dL   LDL Cholesterol 121 (H) 0 - 99 mg/dL   Total CHOL/HDL Ratio 3    NonHDL 136.92   Hemoglobin A1c  Result Value Ref Range  Hgb A1c MFr Bld 5.6 4.6 - 6.5 %      Assessment & Plan:    Routine Health Maintenance and Physical Exam  Immunization History  Administered Date(s) Administered   Influenza,inj,Quad PF,6+ Mos 07/12/2019, 07/21/2021, 07/21/2022   PFIZER(Purple Top)SARS-COV-2 Vaccination 01/29/2020, 02/19/2020, 09/29/2020   Zoster Recombinat (Shingrix) 02/01/2017, 05/18/2017    Health Maintenance  Topic Date Due   COLONOSCOPY (Pts 45-27yr Insurance coverage will need to be confirmed)  09/29/2022   PAP SMEAR-Modifier  11/29/2022   MAMMOGRAM  06/23/2024   TETANUS/TDAP  05/22/2029   INFLUENZA VACCINE   Completed   HIV Screening  Completed   Zoster Vaccines- Shingrix  Completed   HPV VACCINES  Aged Out   COVID-19 Vaccine  Discontinued   Hepatitis C Screening  Discontinued    Discussed health benefits of physical activity, and encouraged her to engage in regular exercise appropriate for her age and condition.  Problem List Items Addressed This Visit       Other   Obesity    Difficulty with maintain healthy diet and limited exercise due to chronic bilateral knee pain secondary to OA. Admits to stress and boredom eating. Lowest weight:165lbs 552yrago Highest weight: 300lbs 1522yrgo. Weight loss attempts in past: small meal portions, weight watcher diet, Lost about 20lbs most, this was 5years ago No previous nutritionist appt or weight loss program Not interest in any surgical intervention. Current Exercise regimen: walking, stationery bike, weight training 4x/week. Wt Readings from Last 3 Encounters:  07/21/22 232 lb 9.6 oz (105.5 kg)  05/14/22 234 lb 1.6 oz (106.2 kg)  04/27/22 236 lb 12.8 oz (107.4 kg)  We discussed use of phentermine vs qsymia vs contrave vs glp-1 injection. She opted to use saxenda injection due to lack of insurance coverage for wegovy. Advised about possible side effects and med contraindications. Saxenda sent F/up in 4weeks      Relevant Medications   Liraglutide -Weight Management (SAX[place]8 MG/3ML SOPN   Other Relevant Orders   Referral to Nutrition and Diabetes Services   Other Visit Diagnoses     Encounter for preventative adult health care exam with abnormal findings    -  Primary   Relevant Orders   CBC (Completed)   Comprehensive metabolic panel (Completed)   Vitamin D deficiency       Relevant Orders   VITAMIN D 25 Hydroxy (Vit-D Deficiency, Fractures) (Completed)   Pure hypercholesterolemia       Relevant Orders   Lipid panel (Completed)   Prediabetes       Relevant Medications   Liraglutide -Weight Management (SAXENDA) 18 MG/3ML SOPN    Other Relevant Orders   Hemoglobin A1c (Completed)   Referral to Nutrition and Diabetes Services   Need for immunization against influenza       Relevant Orders   Flu Vaccine QUAD 6+ mos PF IM (Fluarix Quad PF) (Completed)      Return in about 4 weeks (around 08/18/2022) for Weight management.     ChaWilfred LacyP

## 2022-07-22 ENCOUNTER — Ambulatory Visit (INDEPENDENT_AMBULATORY_CARE_PROVIDER_SITE_OTHER): Payer: PRIVATE HEALTH INSURANCE

## 2022-07-22 ENCOUNTER — Ambulatory Visit (INDEPENDENT_AMBULATORY_CARE_PROVIDER_SITE_OTHER): Payer: PRIVATE HEALTH INSURANCE | Admitting: Orthopedic Surgery

## 2022-07-22 DIAGNOSIS — M25562 Pain in left knee: Secondary | ICD-10-CM

## 2022-07-24 ENCOUNTER — Encounter: Payer: Self-pay | Admitting: Internal Medicine

## 2022-07-28 ENCOUNTER — Encounter: Payer: Self-pay | Admitting: Orthopedic Surgery

## 2022-07-28 NOTE — Progress Notes (Signed)
   Post-Op Visit Note   Patient: Vicki Frazier           Date of Birth: [birth year]           MRN: [id number] Visit Date: 07/22/2022 PCP: Vicki Buffy, NP   Assessment & Plan:  Chief Complaint:  Chief Complaint  Patient presents with   Left Knee - Pain   Visit Diagnoses:  1. Acute pain of left knee     Plan: Patient is 61 year old female who underwent left total knee replacement on 05/21/2022.  Patient is reporting having some lateral knee pain and clicking.  Radiating up to the back of the hip and buttock.  Denies any trauma.  Also going down to the calf.  The clicking has been going on for 10 days.  Takes occasional Norco.  On examination she has excellent extension strength with no patellar maltracking.  No effusion in the knee.  No calf tenderness negative Homans.  Impression is left knee pain which may be radicular from the back.  I think she should keep her scheduled appointment and we can change her from [place] to ibuprofen and see how she does.  Radiographs look good today and structurally the knee moves well also.  Follow-Up Instructions: No follow-ups on file.   Orders:  Orders Placed This Encounter  Procedures   XR KNEE 3 VIEW LEFT   No orders of the defined types were placed in this encounter.   Imaging: No results found.  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   Asthma in adult 11/30/2019   Obesity 11/30/2019   Past Medical History:  Diagnosis Date   Asthma    Hypertension     Family History  Problem Relation Age of Onset    Cancer Father 45       bladder   Cancer Sister 59       melanoma   Obesity Sister    Heart disease Sister    Cancer Maternal Grandmother 80       colon cancer    Past Surgical History:  Procedure Laterality Date   TOTAL KNEE ARTHROPLASTY Left 05/21/2022   Procedure: LEFT TOTAL KNEE ARTHROPLASTY;  Surgeon: Meredith Pel, MD;  Location: [facility];  Service: Orthopedics;  Laterality: Left;   Social History   Occupational History   Not on file  Tobacco Use   Smoking status: Never   Smokeless tobacco: Never  Vaping Use   Vaping Use: Never used  Substance and Sexual Activity   Alcohol use: Never   Drug use: Never   Sexual activity: Not on file

## 2022-08-03 ENCOUNTER — Telehealth: Payer: Self-pay | Admitting: Nurse Practitioner

## 2022-08-03 NOTE — Telephone Encounter (Signed)
Pt stated that saxenda is on back order for atleast 4-5 months and would like to know if she can prescribe alterative medication

## 2022-08-04 MED ORDER — PHENTERMINE HCL 15 MG PO CAPS: 15.0000 mg | ORAL_CAPSULE | ORAL | 0 refills | Status: DC

## 2022-08-04 NOTE — Telephone Encounter (Signed)
Pt advised via VM 

## 2022-08-17 ENCOUNTER — Encounter: Payer: Self-pay | Admitting: Dietician

## 2022-08-17 ENCOUNTER — Encounter: Payer: PRIVATE HEALTH INSURANCE | Attending: Nurse Practitioner | Admitting: Dietician

## 2022-08-17 DIAGNOSIS — Z713 Dietary counseling and surveillance: Secondary | ICD-10-CM | POA: Diagnosis not present

## 2022-08-17 DIAGNOSIS — R7303 Prediabetes: Secondary | ICD-10-CM | POA: Insufficient documentation

## 2022-08-17 DIAGNOSIS — Z6836 Body mass index (BMI) 36.0-36.9, adult: Secondary | ICD-10-CM | POA: Diagnosis not present

## 2022-08-17 NOTE — Progress Notes (Signed)
Medical Nutrition Therapy  Appointment Start time:  41  Appointment End time:  1202  Primary concerns today: Pt states she has long history of weight fluctuations and wants to be able to maintain weight loss. Pt states she had a recent knee injury last year that prevented her from being active which led to weight gain. Had knee replacement in July and is still recovering but is walking again.     Referral diagnosis: obesity and prediabetes Preferred learning style: no preference indicated Learning readiness: ready   NUTRITION ASSESSMENT   Anthropometrics  Ht: 67in Wt: not assessed  Clinical Medical Hx: Asthma, HTN, prediabetes, high cholesterol Medications: reviewed, saxenda- unable to get filled.  Labs: 07/21/22 cholesterol 210, LDL 121, A1C 5.6% Notable Signs/Symptoms: none  Lifestyle & Dietary Hx  Pt states she has hx of weight fluctuations, pt states she was 285lbs in the past, lost to 195lbs in November 2022, then fracture in foot and knee injury prevented her from being able to exercise and had knee replacement in July 2023 and is now around 230lbs.  Pt states she is an emotional eater. Pt reports that she has an all or nothing mentality and that if she goes off of her eating plan she will let it all go.   Pt is retired and has part time job at [employer]. Lives alone but has dog which she takes walking 2-3 times per day.   Pt states her 24 hour recall is her diet within the last few weeks.   Supplements: vitamin D, calcium, MVI Sleep: 4 hours. Pt states she has been sleeping terribly since her knee surgery. Stress / self-care: mild stress Current average weekly physical activity: 3-4 miles daily, does half in morning and half in evening, but only recently since knee pain has started going away. Does some stationary bike. Is considering getting back into pilates/yoga. Pt says she has been cleared for some exercise but not as much as she used to do.   24-Hr Dietary Recall First  Meal: fruit and yogurt smoothie  Snack: none Second Meal: soup OR sandwich ([place] or chicken) Snack: 3pm fruit OR veggies and dip Third Meal: 6pm: roasted vegetables and protein Snack: fruit OR crackers OR popcorn.  Beverages: coffee with low-fat milk and monkfruit sweetener, la croix, herbal tea   NUTRITION DIAGNOSIS  NB-1.1 Food and nutrition-related knowledge deficit As related to prediabetes.  As evidenced by pt report and diet hx.   NUTRITION INTERVENTION  Nutrition education (E-1) on the following topics:  Effect of steroid shots on blood glucose.  Saturated vs unsaturated fat Heart healthy diet for lowering cholesterol and LDL MyPlate Building balanced snacks Effect of physical activity on blood glucose and overall health Importance of adequate sleep Mindfulness  Handouts Provided Include  A1C Chart Dish Up a Healthy Meal Balanced Snacks Nutrition Care Manual: Health Healthy Nutrition Therapy  Learning Style & Readiness for Change Teaching method utilized: Visual & Auditory  Demonstrated degree of understanding via: Teach Back  Barriers to learning/adherence to lifestyle change: none  Goals Established by Pt Aim for 150 minutes of physical activity weekly. Eat more Non-Starchy Vegetables: Aim to make 1/2 of your plate vegetables as often as possible. Minimize added sugars and refined grains Choose whole foods over processed. Make simple meals at home more often than eating out. When snacking, pair a carb & protein.  Eat a balanced diet with whole grains, fruits and vegetables, and lean protein sources. Choose heart-healthy unsaturated fats. Limit saturated fats,  trans fats, and cholesterol intake. Eat more plant-based or vegetarian meals using beans and soy foods for protein. Eat whole, unprocessed foods to limit the amount of sodium (salt) you eat. Mindfulness:  Consistently scheduled meal - avoid skipping  Eat slowly  Away from distraction (sitting in kitchen  or dining room)  Stop eating when satisfied  Before a snack ask, "Am I hungry or eating for another reason?"   "What can I do instead if I am not hungry?" Try to find something every day that brings you joy!   MONITORING & EVALUATION Dietary intake, weekly physical activity, and follow up as needed.  Next Steps  Patient is to call for questions.

## 2022-08-17 NOTE — Patient Instructions (Addendum)
Aim for 150 minutes of physical activity weekly.  Eat more Non-Starchy Vegetables Aim to make 1/2 of your plate vegetables as often as possible.  Minimize added sugars and refined grains  Choose whole foods over processed. Make simple meals at home more often than eating out.  When snacking, pair a carb & protein.   Eat a balanced diet with whole grains, fruits and vegetables, and lean protein sources.  Choose heart-healthy unsaturated fats. Limit saturated fats, trans fats, and cholesterol intake. Eat more plant-based or vegetarian meals using beans and soy foods for protein.  Eat whole, unprocessed foods to limit the amount of sodium (salt) you eat.  Mindfulness:  Consistently scheduled meal - avoid skipping  Eat slowly  Away from distraction (sitting in kitchen or dining room)  Stop eating when satisfied  Before a snack ask, "Am I hungry or eating for another reason?"   "What can I do instead if I am not hungry?"  Try to find something every day that brings you joy!

## 2022-08-20 ENCOUNTER — Telehealth: Payer: Self-pay | Admitting: Nurse Practitioner

## 2022-08-20 ENCOUNTER — Telehealth: Payer: Self-pay

## 2022-08-20 DIAGNOSIS — J4520 Mild intermittent asthma, uncomplicated: Secondary | ICD-10-CM

## 2022-08-20 MED ORDER — ALBUTEROL SULFATE HFA 108 (90 BASE) MCG/ACT IN AERS: 1.0000 | INHALATION_SPRAY | Freq: Four times a day (QID) | RESPIRATORY_TRACT | 0 refills | Status: DC | PRN

## 2022-08-20 NOTE — Addendum Note (Signed)
Addended by: Wilfred Lacy L on: 08/20/2022 10:35 AM   Modules accepted: Orders

## 2022-08-20 NOTE — Telephone Encounter (Signed)
Caller Name: Devika Dragovich Call back phone #: [phone]  Reason for Call: Pt asked for a refill on Albuterol inhaler, she does not use it often.

## 2022-08-20 NOTE — Telephone Encounter (Signed)
Pt would like a refill on her albuterol inhaler per pt she does not use often but the medication has expired. Please advise if you would like for her to schedule and appointment. Pt next schedule appointment is 09/14/22

## 2022-08-20 NOTE — Telephone Encounter (Signed)
Call and LVM per provider notes. Provided info for pt to call if any questions.

## 2022-08-21 ENCOUNTER — Ambulatory Visit (INDEPENDENT_AMBULATORY_CARE_PROVIDER_SITE_OTHER): Payer: PRIVATE HEALTH INSURANCE | Admitting: Orthopedic Surgery

## 2022-08-21 ENCOUNTER — Encounter: Payer: Self-pay | Admitting: Orthopedic Surgery

## 2022-08-21 DIAGNOSIS — Z96652 Presence of left artificial knee joint: Secondary | ICD-10-CM

## 2022-08-21 NOTE — Progress Notes (Signed)
   Post-Op Visit Note   Patient: Vicki Frazier           Date of Birth: 1960-11-28           MRN: [id number] Visit Date: 08/21/2022 PCP: Flossie Buffy, NP   Assessment & Plan:  Chief Complaint:  Chief Complaint  Patient presents with   Left Knee - Follow-up     left total knee replacement on 05/21/2022.   Visit Diagnoses: No diagnosis found.  Plan: Vicki Frazier is a 61 year old patient is 3 months out left total knee replacement.  She is doing well.  No complaints.  She achieved range of motion therapy of 0-1 17.  She is close to that today.  No effusion in the knee.  Plan at this time is to continue to work on leg strengthening exercises.  Dental prophylaxis discussed follow-up as needed she is happy with her results  Follow-Up Instructions: No follow-ups on file.   Orders:  No orders of the defined types were placed in this encounter.  No orders of the defined types were placed in this encounter.   Imaging: No results found.  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   Asthma in adult 11/30/2019   Obesity 11/30/2019   Past Medical History:  Diagnosis Date   Asthma    Hypertension     Family History  Problem Relation Age of Onset   Cancer Father 76       bladder   Cancer Sister 65       melanoma   Obesity Sister    Heart disease Sister    Cancer Maternal Grandmother 80       colon cancer    Past Surgical History:  Procedure Laterality Date   TOTAL KNEE ARTHROPLASTY Left 05/21/2022   Procedure: LEFT TOTAL KNEE ARTHROPLASTY;  Surgeon:  Meredith Pel, MD;  Location: [facility];  Service: Orthopedics;  Laterality: Left;   Social History   Occupational History   Not on file  Tobacco Use   Smoking status: Never   Smokeless tobacco: Never  Vaping Use   Vaping Use: Never used  Substance and Sexual Activity   Alcohol use: Never   Drug use: Never   Sexual activity: Not on file

## 2022-09-09 ENCOUNTER — Telehealth: Payer: Self-pay | Admitting: Nurse Practitioner

## 2022-09-09 NOTE — Telephone Encounter (Signed)
Caller Name: Oviya Ammar Call back phone #: [phone]  Reason for Call: Pt was referred to [facility] in March to have colonoscopy. Due to insurance appt was pushed to November and the referral has since been closed. Can we please reopen?

## 2022-09-09 NOTE — Telephone Encounter (Signed)
Pt is already scheduled for 09/11/22. Referral is still in the chart. No new or reopening is needed

## 2022-09-11 ENCOUNTER — Ambulatory Visit (AMBULATORY_SURGERY_CENTER): Payer: Self-pay

## 2022-09-11 VITALS — Ht 67.0 in | Wt 235.0 lb

## 2022-09-11 DIAGNOSIS — Z800 Family history of malignant neoplasm of digestive organs: Secondary | ICD-10-CM

## 2022-09-11 DIAGNOSIS — Z1211 Encounter for screening for malignant neoplasm of colon: Secondary | ICD-10-CM

## 2022-09-11 MED ORDER — PLENVU 140 G PO SOLR: 1.0000 | ORAL | Status: DC

## 2022-09-11 NOTE — Progress Notes (Signed)

## 2022-09-14 ENCOUNTER — Ambulatory Visit: Payer: 59 | Admitting: Nurse Practitioner

## 2022-09-15 ENCOUNTER — Telehealth: Payer: Self-pay | Admitting: Internal Medicine

## 2022-09-15 DIAGNOSIS — Z800 Family history of malignant neoplasm of digestive organs: Secondary | ICD-10-CM

## 2022-09-15 DIAGNOSIS — Z1211 Encounter for screening for malignant neoplasm of colon: Secondary | ICD-10-CM

## 2022-09-15 MED ORDER — PLENVU 140 G PO SOLR: 1.0000 | Freq: Once | ORAL | 0 refills | Status: AC

## 2022-09-15 NOTE — Telephone Encounter (Signed)
Patient states she went to pharmacy to pick up prep rx. The pharmacy states they have no record of prescription for plenvu sent to them. Please advise.

## 2022-09-15 NOTE — Telephone Encounter (Signed)
Verified pharmacy with pt. Plenvu prep resent to pharmacy on file per request,pt.aware and instructed to give [place] a call back if she has any other concerns or problems with prep,verbalized understanding.

## 2022-09-16 ENCOUNTER — Other Ambulatory Visit: Payer: Self-pay | Admitting: Nurse Practitioner

## 2022-09-16 DIAGNOSIS — J4520 Mild intermittent asthma, uncomplicated: Secondary | ICD-10-CM

## 2022-09-25 ENCOUNTER — Encounter: Payer: Self-pay | Admitting: Nurse Practitioner

## 2022-09-25 ENCOUNTER — Ambulatory Visit (INDEPENDENT_AMBULATORY_CARE_PROVIDER_SITE_OTHER): Payer: PRIVATE HEALTH INSURANCE | Admitting: Nurse Practitioner

## 2022-09-25 VITALS — BP 122/84 | HR 76 | Temp 97.7°F | Ht 67.0 in | Wt 234.2 lb

## 2022-09-25 DIAGNOSIS — K649 Unspecified hemorrhoids: Secondary | ICD-10-CM | POA: Diagnosis not present

## 2022-09-25 MED ORDER — HYDROCORTISONE ACE-PRAMOXINE 1-1 % EX CREA: 1.0000 | TOPICAL_CREAM | Freq: Two times a day (BID) | CUTANEOUS | 0 refills | Status: DC

## 2022-09-25 NOTE — Progress Notes (Signed)
   Acute Office Visit  Subjective:     Patient ID: Vicki Frazier, female    DOB: [birth year], 61 y.o.   MRN: [id number]  Chief Complaint  Patient presents with   Acute Visit    C/o having a lump between her vagina/anus x 1 day.      HPI Patient is in today for bright red blood on the toilet paper when wiping yesterday. She then noticed a bump in her perineal area. It was bleeding off and on yesterday, has not bled today. She had a bowel movement today without any bleeding. She states that she is prone to boils. She states that the area is noticeable, but not painful. She has a history of hemorrhoids and intermittent constipation depending on what she eats. She denies itching.   ROS See pertinent positives and negatives per HPI.     Objective:    BP 122/84   Pulse 76   Temp 97.7 F (36.5 C) (Temporal)   Ht '5\' 7"'$  (1.702 m)   Wt 234 lb 3.2 oz (106.2 kg)   SpO2 92%   BMI 36.68 kg/m     Physical Exam Vitals and nursing note reviewed. Exam conducted with a chaperone present.  Constitutional:      General: She is not in acute distress.    Appearance: Normal appearance.  HENT:     Head: Normocephalic.  Eyes:     Conjunctiva/sclera: Conjunctivae normal.  Pulmonary:     Effort: Pulmonary effort is normal.  Genitourinary:    Rectum: External hemorrhoid present.  Musculoskeletal:     Cervical back: Normal range of motion.  Skin:    General: Skin is warm.  Neurological:     General: No focal deficit present.     Mental Status: She is alert and oriented to person, place, and time.  Psychiatric:        Mood and Affect: Mood normal.        Behavior: Behavior normal.        Thought Content: Thought content normal.        Judgment: Judgment normal.      Assessment & Plan:   Problem List Items Addressed This Visit       Cardiovascular and Mediastinum   Hemorrhoids - Primary    External hemorrhoid noted near rectum on exam.  We will have her start rectal cream twice a  day.  She can also do sitz bath to help with comfort.  Encouraged her to drink plenty of fluids and take MiraLAX as needed for constipation.  Follow-up if symptoms worsen or do not improve.       Meds ordered this encounter  Medications   pramoxine-hydrocortisone (PROCTOCREAM-HC) 1-1 % rectal cream    Sig: Place 1 Application rectally 2 (two) times daily.    Dispense:  30 g    Refill:  0    No follow-ups on file.  Charyl Dancer, NP

## 2022-09-25 NOTE — Assessment & Plan Note (Signed)
External hemorrhoid noted near rectum on exam.  We will have her start rectal cream twice a day.  She can also do sitz bath to help with comfort.  Encouraged her to drink plenty of fluids and take MiraLAX as needed for constipation.  Follow-up if symptoms worsen or do not improve.

## 2022-09-25 NOTE — Patient Instructions (Signed)
It was great to see you!  Start hydrocortisone cream twice a day on your rectum (this one is specially for rectal use). Make sure you are drinking plenty of fluids, you can take miralax as needed for constipation  You can also do sitz baths to help with symptoms.   Let's follow-up if your symptoms worsen or don't improve.   Take care,  Vance Peper, NP

## 2022-10-08 ENCOUNTER — Encounter: Payer: Self-pay | Admitting: Internal Medicine

## 2022-10-09 ENCOUNTER — Ambulatory Visit (AMBULATORY_SURGERY_CENTER): Payer: PRIVATE HEALTH INSURANCE | Admitting: Internal Medicine

## 2022-10-09 ENCOUNTER — Encounter: Payer: Self-pay | Admitting: Internal Medicine

## 2022-10-09 VITALS — BP 121/72 | HR 66 | Temp 96.8°F | Resp 16 | Ht 67.0 in | Wt 235.0 lb

## 2022-10-09 DIAGNOSIS — K621 Rectal polyp: Secondary | ICD-10-CM

## 2022-10-09 DIAGNOSIS — D120 Benign neoplasm of cecum: Secondary | ICD-10-CM | POA: Diagnosis not present

## 2022-10-09 DIAGNOSIS — D128 Benign neoplasm of rectum: Secondary | ICD-10-CM

## 2022-10-09 DIAGNOSIS — Z800 Family history of malignant neoplasm of digestive organs: Secondary | ICD-10-CM

## 2022-10-09 DIAGNOSIS — Z1211 Encounter for screening for malignant neoplasm of colon: Secondary | ICD-10-CM | POA: Diagnosis not present

## 2022-10-09 MED ORDER — SODIUM CHLORIDE 0.9 % IV SOLN: 500.0000 mL | Freq: Once | INTRAVENOUS | Status: DC

## 2022-10-09 NOTE — Progress Notes (Signed)
Pt's states no medical or surgical changes since previsit or office visit. 

## 2022-10-09 NOTE — Patient Instructions (Signed)
Resume previous diet and medications. Awaiting pathology results. Repeat Colonoscopy in 7-10 years for surveillance. Handout provided on Colon polyps.  YOU HAD AN ENDOSCOPIC PROCEDURE TODAY AT [facility]:   Refer to the procedure report that was given to you for any specific questions about what was found during the examination.  If the procedure report does not answer your questions, please call your gastroenterologist to clarify.  If you requested that your care partner not be given the details of your procedure findings, then the procedure report has been included in a sealed envelope for you to review at your convenience later.  YOU SHOULD EXPECT: Some feelings of bloating in the abdomen. Passage of more gas than usual.  Walking can help get rid of the air that was put into your GI tract during the procedure and reduce the bloating. If you had a lower endoscopy (such as a colonoscopy or flexible sigmoidoscopy) you may notice spotting of blood in your stool or on the toilet paper. If you underwent a bowel prep for your procedure, you may not have a normal bowel movement for a few days.  Please Note:  You might notice some irritation and congestion in your nose or some drainage.  This is from the oxygen used during your procedure.  There is no need for concern and it should clear up in a day or so.  SYMPTOMS TO REPORT IMMEDIATELY:  Following lower endoscopy (colonoscopy or flexible sigmoidoscopy):  Excessive amounts of blood in the stool  Significant tenderness or worsening of abdominal pains  Swelling of the abdomen that is new, acute  Fever of 100F or higher  For urgent or emergent issues, a gastroenterologist can be reached at any hour by calling [phone]. Do not use MyChart messaging for urgent concerns.    DIET:  We do recommend a small meal at first, but then you may proceed to your regular diet.  Drink plenty of fluids but you should avoid alcoholic beverages for  24 hours.  ACTIVITY:  You should plan to take it easy for the rest of today and you should NOT DRIVE or use heavy machinery until tomorrow (because of the sedation medicines used during the test).    FOLLOW UP: Our staff will call the number listed on your records the next business day following your procedure.  We will call around 7:15- 8:00 am to check on you and address any questions or concerns that you may have regarding the information given to you following your procedure. If we do not reach you, we will leave a message.     If any biopsies were taken you will be contacted by phone or by letter within the next 1-3 weeks.  Please call us at [phone] if you have not heard about the biopsies in 3 weeks.    SIGNATURES/CONFIDENTIALITY: You and/or your care partner have signed paperwork which will be entered into your electronic medical record.  These signatures attest to the fact that that the information above on your After Visit Summary has been reviewed and is understood.  Full responsibility of the confidentiality of this discharge information lies with you and/or your care-partner.

## 2022-10-09 NOTE — Op Note (Addendum)
[facility] Patient Name: Vicki Frazier Procedure Date: 10/09/2022 8:47 AM MRN: [id number] Endoscopist: Docia Chuck. Henrene Pastor , MD, [id number] Age: 61 Referring MD:  Date of Birth: [birth year] Gender: Female Account #: [id number] Procedure:                Colonoscopy with cold snare polypectomy x1; biopsy                            polypectomy x 1 Indications:              Screening for colorectal malignant neoplasm Medicines:                Monitored Anesthesia Care Procedure:                Pre-Anesthesia Assessment:                           - Prior to the procedure, a History and Physical                            was performed, and patient medications and                            allergies were reviewed. The patient's tolerance of                            previous anesthesia was also reviewed. The risks                            and benefits of the procedure and the sedation                            options and risks were discussed with the patient.                            All questions were answered, and informed consent                            was obtained. Prior Anticoagulants: The patient has                            taken no anticoagulant or antiplatelet agents. ASA                            Grade Assessment: II - A patient with mild systemic                            disease. After reviewing the risks and benefits,                            the patient was deemed in satisfactory condition to                            undergo the procedure.  After obtaining informed consent, the colonoscope                            was passed under direct vision. Throughout the                            procedure, the patient's blood pressure, pulse, and                            oxygen saturations were monitored continuously. The                            Olympus Scope [id number] was introduced through the                            anus and  advanced to the the cecum, identified by                            appendiceal orifice and ileocecal valve. The                            ileocecal valve, appendiceal orifice, and rectum                            were photographed. The quality of the bowel                            preparation was excellent. The colonoscopy was                            performed without difficulty. The patient tolerated                            the procedure well. The bowel preparation used was                            SUPREP via split dose instruction. Scope In: 9:02:04 AM Scope Out: 9:17:07 AM Scope Withdrawal Time: 0 hours 12 minutes 18 seconds  Total Procedure Duration: 0 hours 15 minutes 3 seconds  Findings:                 A 6 mm polyp was found in the cecum. The polyp was                            sessile. The polyp was removed with a cold snare.                            Resection and retrieval were complete.                           A 1 mm polyp was found in the rectum. The polyp was  removed with a jumbo cold forceps. Resection and                            retrieval were complete.                           Internal hemorrhoids were found during retroflexion.                           The exam was otherwise without abnormality on                            direct and retroflexion views. Complications:            No immediate complications. Estimated blood loss:                            None. Estimated Blood Loss:     Estimated blood loss: none. Impression:               - One 6 mm polyp in the cecum, removed with a cold                            snare. Resected and retrieved.                           - One 1 mm polyp in the rectum, removed with a                            jumbo cold forceps. Resected and retrieved.                           - Internal hemorrhoids.                           - The examination was otherwise normal on direct                             and retroflexion views. Recommendation:           - Repeat colonoscopy in 7-10 years for surveillance.                           - Patient has a contact number available for                            emergencies. The signs and symptoms of potential                            delayed complications were discussed with the                            patient. Return to normal activities tomorrow.                            Written discharge instructions were provided to the  patient.                           - Resume previous diet.                           - Continue present medications.                           - Await pathology results. Docia Chuck. Henrene Pastor, MD 10/09/2022 9:22:45 AM This report has been signed electronically.

## 2022-10-09 NOTE — Progress Notes (Signed)
Report to PACU, RN, vss, BBS= Clear.  

## 2022-10-09 NOTE — Progress Notes (Signed)
HISTORY OF PRESENT ILLNESS:  Vicki Frazier is a 61 y.o. female presents today for screening colonoscopy.  Grandfather with colon cancer, history of.  Index colonoscopy in [place].  She tells me this was unremarkable.  No complaints  REVIEW OF SYSTEMS:  All non-GI ROS negative except for  Past Medical History:  Diagnosis Date   Asthma    Hypertension     Past Surgical History:  Procedure Laterality Date   TOTAL KNEE ARTHROPLASTY Left 05/21/2022   Procedure: LEFT TOTAL KNEE ARTHROPLASTY;  Surgeon: Vicki Pel, MD;  Location: [facility];  Service: Orthopedics;  Laterality: Left;    Social History Vicki Frazier  reports that she has never smoked. She has never used smokeless tobacco. She reports that she does not drink alcohol and does not use drugs.  family history includes Cancer (age of onset: 64) in her sister; Cancer (age of onset: 77) in her father; Cancer (age of onset: 18) in her maternal grandmother; Colon cancer in her maternal grandmother; Colon polyps in her maternal grandmother and sister; Heart disease in her sister; Obesity in her sister.  Allergies  Allergen Reactions   Molds & Smuts     Other reaction(s): Eye Redness   Grass Pollen(K-O-R-T-Swt Vern) Other (See Comments)    Itchy eyes   Other Itching    Animal dander Runny nose itchy eyes   Peanut Allergen Powder-Dnfp Itching    Runny nose itchy eyes       PHYSICAL EXAMINATION: Vital signs: BP 138/84   Pulse 65   Temp (!) 96.8 F (36 C)   Resp 11   Ht '5\' 7"'$  (1.702 m)   Wt 235 lb (106.6 kg)   SpO2 100%   BMI 36.81 kg/m  General: Well-developed, well-nourished, no acute distress HEENT: Sclerae are anicteric, conjunctiva pink. Oral mucosa intact Lungs: Clear Heart: Regular Abdomen: soft, nontender, nondistended, no obvious ascites, no peritoneal signs, normal bowel sounds. No organomegaly. Extremities: No edema Psychiatric: alert and oriented x3. Cooperative     ASSESSMENT:  Cancer  screening   PLAN:   Screening colonoscopy

## 2022-10-09 NOTE — Progress Notes (Signed)
Called to room to assist during endoscopic procedure.  Patient ID and intended procedure confirmed with present staff. Received instructions for my participation in the procedure from the performing physician.  

## 2022-10-12 ENCOUNTER — Telehealth: Payer: Self-pay

## 2022-10-12 NOTE — Telephone Encounter (Signed)
Follow up call to pt, lm for pt to call if having any difficulty with normal activities or eating and drinking.  Also to call if any other questions or concerns.  

## 2022-10-13 ENCOUNTER — Encounter: Payer: Self-pay | Admitting: Internal Medicine

## 2022-12-04 ENCOUNTER — Telehealth: Payer: Self-pay

## 2022-12-04 DIAGNOSIS — M5136 Other intervertebral disc degeneration, lumbar region: Secondary | ICD-10-CM | POA: Insufficient documentation

## 2022-12-04 NOTE — Telephone Encounter (Signed)
Patient Advocate Encounter   Received notification from [facility] that prior authorization for Saxenda is required.   PA submitted on 12/04/2022 Key [id number] Status is pending

## 2022-12-19 DIAGNOSIS — M7061 Trochanteric bursitis, right hip: Secondary | ICD-10-CM | POA: Insufficient documentation

## 2023-03-03 ENCOUNTER — Ambulatory Visit (INDEPENDENT_AMBULATORY_CARE_PROVIDER_SITE_OTHER): Payer: 59 | Admitting: Nurse Practitioner

## 2023-03-03 ENCOUNTER — Encounter: Payer: Self-pay | Admitting: Nurse Practitioner

## 2023-03-03 VITALS — BP 117/80 | HR 78 | Temp 97.9°F | Resp 16 | Ht 67.0 in | Wt 248.8 lb

## 2023-03-03 DIAGNOSIS — W57XXXA Bitten or stung by nonvenomous insect and other nonvenomous arthropods, initial encounter: Secondary | ICD-10-CM

## 2023-03-03 DIAGNOSIS — S70361A Insect bite (nonvenomous), right thigh, initial encounter: Secondary | ICD-10-CM

## 2023-03-03 DIAGNOSIS — A6920 Lyme disease, unspecified: Secondary | ICD-10-CM | POA: Diagnosis not present

## 2023-03-03 MED ORDER — DOXYCYCLINE HYCLATE 100 MG PO TABS
100.0000 mg | ORAL_TABLET | Freq: Two times a day (BID) | ORAL | 0 refills | Status: AC
Start: 2023-03-03 — End: 2023-03-13

## 2023-03-03 NOTE — Patient Instructions (Signed)
Go to lab  Tick Bite Information, Adult  Ticks are insects that draw blood for food. They climb onto people and animals that brush against the leaves and grasses that they live in. They then bite and attach to the skin. Most ticks are harmless, but some ticks may carry germs that can cause disease. These germs are spread to a person through a bite. To lower your risk of getting a disease from a tick bite, make sure you: Take steps to prevent tick bites. Check for ticks after being outdoors where ticks live. Watch for symptoms of disease if a tick attached to you or if you think a tick bit you. How can I prevent tick bites? Take these steps to help prevent tick bites when you go outdoors in an area where ticks live: Before you go outdoors: Wear long sleeves and long pants to protect your skin from ticks. Wear light-colored clothing so you can see ticks easier. Tuck your pant legs into your socks. Apply insect repellent that has DEET (20% or higher), picaridin, or IR3535 in it to the following areas: Any bare skin. Avoid areas around the eyes and mouth. Edges of clothing, like the top of your boots, the bottom of your pant legs, and your sleeve cuffs. Consider applying an insect repellant that contains permethrin. Follow the instructions on the label. Do not apply permethrin directly to the skin. Instead, apply to the following areas: Clothing and shoes. Outdoor gear and tents. When you are outdoors: Avoid walking through areas with long grass. If you are walking on a trail, stay in the middle of the trail so your skin, hair, and clothing do not touch the bushes. Check for ticks on your clothing, hair, and skin often while you are outdoors. Check again before you go inside. When you go indoors: Check your clothing for ticks. Tumble dry clothes in a dryer on high heat for at least 10 minutes. If clothes are damp, additional time may be needed. If clothes require washing, use hot water. Check  your gear and pets. Shower soon after being outdoors. Check your body for ticks. Do a full body check using a mirror. Be sure to check your scalp, neck, armpits, waist, groin, and joint areas. These are the spots where ticks attach themselves most often. What is the best way to remove a tick?  Remove the tick as soon as possible. Removing it can prevent germs from passing to your body. Do not remove the tick with your bare fingers. Do not try to remove a tick with heat, alcohol, petroleum jelly, or fingernail polish. These things can cause the tick to salivate and regurgitate into your bloodstream, increasing your risk of getting a disease. To remove a tick that is crawling on your skin: Go outside and brush the tick off. Use tape or a lint roller. To remove a tick that is attached to your skin: Wash your hands. If you have gloves, put them on. Use a fine-tipped tweezer, curved forceps, or a tick-removal tool to gently grasp the tick as close to your skin and the tick's head as possible. Gently pull with a steady, upward, and even pressure until the tick lets go. While removing the tick: Take care to keep the tick's head attached to its body. Do not twist or jerk the tick. This can make the tick's head or mouth parts break off and stay in your skin. If this happens, try to remove the mouth parts with tweezers. If you  cannot remove them, leave the area alone and let the skin heal. Do not squeeze or crush the tick's body. This could force disease-carrying fluids from the tick into your body. What should I do after removing a tick? Clean the bite area and your hands with soap and water, rubbing alcohol, or an iodine scrub. If an antiseptic cream or ointment is available, put a small amount on the bite area. Wash and disinfect any tools that you used to remove the tick. How should I dispose of a tick? To dispose of a live tick, use one of these methods: Place it in rubbing alcohol. Place it in  a sealed bag or container, and throw it away. Wrap it tightly in tape, and throw it away. Flush it down the toilet. Where to find more information Centers for Disease Control and Prevention: GyrateAtrophy.si [place]. Environmental Protection Agency: RelocationNetworking.fi Contact a health care provider if: You have symptoms of a disease after a tick bite. Symptoms of a tick-borne disease can occur from moments after the tick bites to 30 days after a tick is removed. Symptoms include: Fever or chills. A red rash that makes a circle (bull's-eye rash) in the bite area. Redness and swelling in the bite area. Headache or stiff neck. Muscle, joint, or bone pain. Abnormal tiredness. Numbness in your legs or trouble walking or moving your legs. Tender or swollen lymph glands. Abdominal pain, vomiting, diarrhea, or weight loss. Get help right away if: You are not able to remove a tick. You have muscle weakness or paralysis. Your symptoms get worse or you experience new symptoms. You find an engorged tick on your skin and you are in an area where there is a higher risk of disease from ticks. Summary Ticks may carry germs that can spread to a person through a bite. These germs can cause disease. Wear protective clothing and use insect repellent to prevent tick bites. Follow the instructions on the label. If you find a tick on your body, remove it as soon as possible. If the tick is attached, do not try to remove it with heat, alcohol, petroleum jelly, or fingernail polish. If you have symptoms of a disease after being bitten by a tick, contact a health care provider. This information is not intended to replace advice given to you by your health care provider. Make sure you discuss any questions you have with your health care provider. Document Revised: 01/26/2022 Document Reviewed: 01/26/2022 Elsevier Patient Education  2023 [employer].

## 2023-03-03 NOTE — Assessment & Plan Note (Addendum)
Recurrent tick bite due to pet at home. Treated with doxycycline twice last year. With recent bite on right posterior thigh tick was scratched off 5days ago. Unable to find tick and not sure if piece still embedded. Associated with itching and redness. No fever, no malaise, fatigue at this time.   Removed remnant of tick with scaple, minimal bleeding, applied triple antibiotic ointment and covered with bandaid. Doxycycline prescribed Check for tick borne disease

## 2023-03-03 NOTE — Progress Notes (Signed)
Established Patient Visit  Patient: Vicki Frazier   DOB: [birth year]   62 y.o. Female  MRN: [id number] Visit Date: 03/03/2023  Subjective:    Chief Complaint  Patient presents with   Insect Bite    Right upper thigh.  She felt something bite her and scratched it off.  Not sure what it was   HPI Erythema migrans (Lyme disease) Recurrent tick bite due to pet at home. Treated with doxycycline twice last year. With recent bite on right posterior thigh tick was scratched off 5days ago. Unable to find tick and not sure if piece still embedded. Associated with itching and redness. No fever, no malaise, fatigue at this time.   Removed remnant of tick with scaple, minimal bleeding, applied triple antibiotic ointment and covered with bandaid. Doxycycline prescribed Check for tick borne disease  Reviewed medical, surgical, and social history today  Medications: Outpatient Medications Prior to Visit  Medication Sig   acetaminophen (TYLENOL) 325 MG tablet    albuterol (VENTOLIN HFA) 108 (90 Base) MCG/ACT inhaler INHALE 1-2 PUFFS BY MOUTH EVERY 6 HOURS AS NEEDED FOR WHEEZE OR SHORTNESS OF BREATH   Calcium Carb-Cholecalciferol (CALCIUM 600+D) 600-20 MG-MCG TABS Take 1 tablet by mouth daily.   cetirizine (ZYRTEC) 10 MG tablet Take 10 mg by mouth daily.   Cholecalciferol (VITAMIN D3) 50 MCG (2000 UT) CAPS Take 2,000 Units by mouth 2 (two) times daily.   fluticasone (FLONASE) 50 MCG/ACT nasal spray Place 1 spray into both nostrils daily.   Multiple Vitamin (MULTI-VITAMIN) tablet Take 1 tablet by mouth daily. Woman   pramoxine-hydrocortisone (PROCTOCREAM-HC) 1-1 % rectal cream Place 1 Application rectally 2 (two) times daily.   [DISCONTINUED] predniSONE (DELTASONE) 10 MG tablet TAKE 6 TAB X 2 DAYS, 5 TAB X 2 DAYS, 4 TAB X 2 DAYS, 3 TAB X 2 DAYS, 2 TAB X 2 DAYS, 1 TAB X 2 DAYS   No facility-administered medications prior to visit.   Reviewed past medical and social history.   ROS  per HPI above      Objective:  BP 117/80 (BP Location: Right Arm, Patient Position: Sitting, Cuff Size: Large)   Pulse 78   Temp 97.9 F (36.6 C) (Temporal)   Resp 16   Ht  (1.702 m)   Wt 248 lb 12.8 oz (112.9 kg)   SpO2 98%   BMI 38.97 kg/m      Physical Exam Skin:    Findings: Erythema and rash present. Rash is papular.          No results found for any visits on 03/03/23.    Assessment & Plan:    Problem List Items Addressed This Visit       Musculoskeletal and Integument   Erythema migrans (Lyme disease)    Recurrent tick bite due to pet at home. Treated with doxycycline twice last year. With recent bite on right posterior thigh tick was scratched off 5days ago. Unable to find tick and not sure if piece still embedded. Associated with itching and redness. No fever, no malaise, fatigue at this time.   Removed remnant of tick with scaple, minimal bleeding, applied triple antibiotic ointment and covered with bandaid. Doxycycline prescribed Check for tick borne disease      Relevant Medications   doxycycline (VIBRA-TABS) 100 MG tablet   Other Relevant Orders   Rocky mtn spotted fvr abs pnl(IgG+IgM)   Tickborne Disease Antibody Profile, Serum  Other Visit Diagnoses     Tick bite of right thigh, initial encounter    -  Primary   Relevant Medications   doxycycline (VIBRA-TABS) 100 MG tablet   Other Relevant Orders   Rocky mtn spotted fvr abs pnl(IgG+IgM)   Tickborne Disease Antibody Profile, Serum      Return in about 5 months (around 07/22/2023), or if symptoms worsen or fail to improve, for CPE (fasting).     Alysia Penna, NP

## 2023-03-04 LAB — TICKBORNE DISEASE ANTIBODY PROFILE, SERUM
Babesia microti IgG: <1:10 {titer}
E.Chaffeensis (HME) IgG: NEGATIVE
HGE IgG Titer: NEGATIVE
Lyme Total Antibody EIA: NEGATIVE

## 2023-03-08 LAB — ROCKY MTN SPOTTED FVR ABS PNL(IGG+IGM)
RMSF IgG: NOT DETECTED
RMSF IgM: NOT DETECTED

## 2023-03-09 NOTE — Progress Notes (Signed)
negative

## 2023-03-19 DIAGNOSIS — M47816 Spondylosis without myelopathy or radiculopathy, lumbar region: Secondary | ICD-10-CM | POA: Insufficient documentation

## 2023-03-20 DIAGNOSIS — M5416 Radiculopathy, lumbar region: Secondary | ICD-10-CM | POA: Insufficient documentation

## 2023-06-17 ENCOUNTER — Other Ambulatory Visit: Payer: Self-pay | Admitting: Nurse Practitioner

## 2023-06-17 DIAGNOSIS — Z1231 Encounter for screening mammogram for malignant neoplasm of breast: Secondary | ICD-10-CM

## 2023-06-28 ENCOUNTER — Ambulatory Visit
Admission: RE | Admit: 2023-06-28 | Discharge: 2023-06-28 | Disposition: A | Discharge status: Home / Self Care | Payer: PRIVATE HEALTH INSURANCE | Source: Ambulatory Visit | Attending: Nurse Practitioner | Admitting: Nurse Practitioner

## 2023-06-28 DIAGNOSIS — Z1231 Encounter for screening mammogram for malignant neoplasm of breast: Secondary | ICD-10-CM

## 2023-07-21 ENCOUNTER — Ambulatory Visit (INDEPENDENT_AMBULATORY_CARE_PROVIDER_SITE_OTHER): Payer: PRIVATE HEALTH INSURANCE | Admitting: Nurse Practitioner

## 2023-07-21 ENCOUNTER — Encounter: Payer: Self-pay | Admitting: Nurse Practitioner

## 2023-07-21 VITALS — BP 120/74 | HR 60 | Temp 94.2°F | Ht 67.0 in | Wt 227.0 lb

## 2023-07-21 DIAGNOSIS — Z23 Encounter for immunization: Secondary | ICD-10-CM

## 2023-07-21 DIAGNOSIS — L600 Ingrowing nail: Secondary | ICD-10-CM | POA: Diagnosis not present

## 2023-07-21 DIAGNOSIS — M79675 Pain in left toe(s): Secondary | ICD-10-CM | POA: Diagnosis not present

## 2023-07-21 NOTE — Progress Notes (Signed)
Established Patient Visit  Patient: Vicki Frazier   DOB: 1961-08-16   62 y.o. Female  MRN: [id number] Visit Date: 07/21/2023  Subjective:    Chief Complaint  Patient presents with   Toe Pain    Left foot 2 toe and right foot big toe nail growing into skin painful   Toe Pain  There was no injury mechanism. The pain is present in the left toes and right toes. The quality of the pain is described as aching. The pain is moderate. The pain has been Constant since onset. Pertinent negatives include no inability to bear weight, loss of motion, loss of sensation, muscle weakness, numbness or tingling. She reports no foreign bodies present. The symptoms are aggravated by weight bearing and palpation. She has tried nothing for the symptoms.   Reviewed medical, surgical, and social history today  Medications: Outpatient Medications Prior to Visit  Medication Sig   acetaminophen (TYLENOL) 325 MG tablet    Calcium Carb-Cholecalciferol (CALCIUM 600+D) 600-20 MG-MCG TABS Take 1 tablet by mouth daily.   cetirizine (ZYRTEC) 10 MG tablet Take 10 mg by mouth daily.   Cholecalciferol (VITAMIN D3) 50 MCG (2000 UT) CAPS Take 2,000 Units by mouth 2 (two) times daily.   fluticasone (FLONASE) 50 MCG/ACT nasal spray Place 1 spray into both nostrils daily.   Multiple Vitamin (MULTI-VITAMIN) tablet Take 1 tablet by mouth daily. Woman   pramoxine-hydrocortisone (PROCTOCREAM-HC) 1-1 % rectal cream Place 1 Application rectally 2 (two) times daily.   [DISCONTINUED] albuterol (VENTOLIN HFA) 108 (90 Base) MCG/ACT inhaler INHALE 1-2 PUFFS BY MOUTH EVERY 6 HOURS AS NEEDED FOR WHEEZE OR SHORTNESS OF BREATH (Patient not taking: Reported on 07/21/2023)   No facility-administered medications prior to visit.   Reviewed past medical and social history.   ROS per HPI above      Objective:  BP 120/74   Pulse 60   Temp (!) 94.2 F (34.6 C) (Temporal)   Ht 5\' 7"  (1.702 m)   Wt 227 lb (103 kg)   SpO2 98%    BMI 35.55 kg/m      Physical Exam Vitals reviewed.  Cardiovascular:     Rate and Rhythm: Normal rate.     Pulses:          Dorsalis pedis pulses are 2+ on the right side and 2+ on the left side.       Posterior tibial pulses are 2+ on the right side and 2+ on the left side.  Feet:     Right foot:     Skin integrity: Callus and dry skin present. No ulcer, blister, skin breakdown, erythema, warmth or fissure.     Toenail Condition: Right toenails are ingrown.     Left foot:     Skin integrity: Callus and dry skin present. No ulcer, blister, skin breakdown, erythema, warmth or fissure.     Toenail Condition: Left toenails are ingrown.  Neurological:     Mental Status: She is alert.     No results found for any visits on 07/21/23.    Assessment & Plan:    Problem List Items Addressed This Visit   None Visit Diagnoses     Pain of toe of left foot    -  Primary   Relevant Orders   Ambulatory referral to Podiatry   Need for influenza vaccination       Relevant Orders   Flu vaccine  trivalent PF, 6mos and older(Flulaval,Afluria,Fluarix,Fluzone) (Completed)   Ingrown toenail without infection          Return if symptoms worsen or fail to improve.     Alysia Penna, NP

## 2023-07-21 NOTE — Assessment & Plan Note (Signed)
Chronic but worse in last 8months ago. followed by [facility] Dr. Ethelene Hal,  Has completed PT with no improvement, received 2spinal injection in last 6months with some improvement.  Triggered prolong walking and standing. Bilateral and radiates to right leg (posterior). Unable to tolerate gabapentin Lumbar X-ray and MRI completed. Normal hip x-ray.

## 2023-07-29 ENCOUNTER — Encounter: Payer: Self-pay | Admitting: Podiatry

## 2023-07-29 ENCOUNTER — Ambulatory Visit (INDEPENDENT_AMBULATORY_CARE_PROVIDER_SITE_OTHER): Payer: PRIVATE HEALTH INSURANCE | Admitting: Podiatry

## 2023-07-29 DIAGNOSIS — L600 Ingrowing nail: Secondary | ICD-10-CM | POA: Diagnosis not present

## 2023-07-29 MED ORDER — NEOMYCIN-POLYMYXIN-HC 1 % OT SOLN: OTIC | 1 refills | Status: AC

## 2023-07-29 NOTE — Patient Instructions (Signed)

## 2023-08-01 NOTE — Progress Notes (Signed)
Subjective:  Patient ID: Vicki Frazier, female    DOB: [birth year],  MRN: [id number] HPI Chief Complaint  Patient presents with   Toe Pain     2nd toe left - lateral border, tender x 5 weeks, PCP didn't seem to think it was ingrown, toenail is thick, also hallux left has a curved nail but not sore   New Patient (Initial Visit)    62 y.o. female presents with the above complaint.   ROS: Denies fever chills nausea vomit muscle aches pains calf pain back pain chest pain shortness of breath.  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   Asthma    Hypertension    Past Surgical History:  Procedure Laterality Date   TOTAL KNEE ARTHROPLASTY Left 05/21/2022   Procedure: LEFT TOTAL KNEE ARTHROPLASTY;  Surgeon: Cammy Copa, MD;  Location: [facility] OR;  Service: Orthopedics;  Laterality: Left;    Current Outpatient Medications:    NEOMYCIN-POLYMYXIN-HYDROCORTISONE (CORTISPORIN) 1 % SOLN OTIC solution, Apply 1-2 drops to toe BID after soaking, Disp: 10 mL, Rfl: 1   acetaminophen (TYLENOL) 325 MG tablet, , Disp: , Rfl:    Calcium Carb-Cholecalciferol (CALCIUM 600+D) 600-20 MG-MCG TABS, Take 1 tablet by mouth daily., Disp: , Rfl:    cetirizine (ZYRTEC) 10 MG tablet, Take 10 mg by mouth daily., Disp: , Rfl:    Cholecalciferol (VITAMIN D3) 50 MCG (2000 UT) CAPS, Take 2,000 Units by mouth 2 (two) times daily., Disp: , Rfl:    fluticasone (FLONASE) 50 MCG/ACT nasal spray, Place 1 spray into both nostrils daily., Disp: , Rfl:    Multiple Vitamin (MULTI-VITAMIN) tablet, Take 1 tablet by mouth daily. Woman, Disp: , Rfl:   Allergies  Allergen Reactions   Molds & Smuts     Other reaction(s): Eye Redness   Grass Pollen(K-O-R-T-Swt Vern) Other (See Comments)    Itchy eyes   Other Itching    Animal dander Runny nose itchy eyes   Peanut Allergen Powder-Dnfp Itching    Runny nose itchy eyes   Peanut-Containing Drug Products Other (See Comments)   Review of  Systems Objective:  There were no vitals filed for this visit.  General: Well developed, nourished, in no acute distress, alert and oriented x3   Dermatological: Skin is warm, dry and supple bilateral. Nails x 10 are well maintained; remaining integument appears unremarkable at this time. There are no open sores, no preulcerative lesions, no rash or signs of infection present.  Sharp incurvated nail margins to the fibular border hallux right and second digit left.  No erythema just tenderness no cellulitis drainage or odor  Vascular: Dorsalis Pedis artery and Posterior Tibial artery pedal pulses are 2/4 bilateral with immedate capillary fill time. Pedal hair growth present. No varicosities and no lower extremity edema present bilateral.   Neruologic: Grossly intact via light touch bilateral. Vibratory intact via tuning fork bilateral. Protective threshold with Semmes Wienstein monofilament intact to all pedal sites bilateral. Patellar and Achilles deep tendon reflexes 2+ bilateral. No Babinski or clonus noted bilateral.   Musculoskeletal: No gross boney pedal deformities bilateral. No pain, crepitus, or limitation noted with foot and ankle range of motion bilateral. Muscular strength 5/5 in all groups tested bilateral.  Gait: Unassisted, Nonantalgic.    Radiographs:  None taken  Assessment & Plan:   Assessment: Ingrown toenails hallux and second digit  Plan: Partial chemical matrixectomy's performed bilateral tolerated procedure well without complications.  Was given both oral and home-going structure  to care and soaking of the toe as well as a prescription of Cortisporin Otic be applied twice daily after soaking.  Will follow-up with her in 2 weeks to make sure she is doing well any questions or concerns she will notify us immediately.     Vendetta Pittinger T. [place], [place]

## 2023-08-12 ENCOUNTER — Ambulatory Visit (INDEPENDENT_AMBULATORY_CARE_PROVIDER_SITE_OTHER): Payer: PRIVATE HEALTH INSURANCE | Admitting: Podiatry

## 2023-08-12 ENCOUNTER — Encounter: Payer: Self-pay | Admitting: Podiatry

## 2023-08-12 DIAGNOSIS — Z9889 Other specified postprocedural states: Secondary | ICD-10-CM

## 2023-08-12 DIAGNOSIS — L600 Ingrowing nail: Secondary | ICD-10-CM

## 2023-08-12 NOTE — Progress Notes (Signed)
She presents today for follow-up of her matricectomy's fibular border hallux right fibular border second digit left foot.  She states they are little sore but they are doing well.  She continues to soak them.  Objective: Vital signs stable alert oriented x 3 there is mild erythema no cellulitis drainage or odor scabs are intact no purulence.  Assessment: Well-healing surgical toes bilateral.  Plan: I continue to soak Epsom salts and warm water until the redness has resolved completely.  Should they become more painful red or purulent she will notify us.

## 2024-05-29 ENCOUNTER — Other Ambulatory Visit: Payer: Self-pay | Admitting: Nurse Practitioner

## 2024-05-29 DIAGNOSIS — Z1231 Encounter for screening mammogram for malignant neoplasm of breast: Secondary | ICD-10-CM

## 2024-06-28 ENCOUNTER — Ambulatory Visit
Admission: RE | Admit: 2024-06-28 | Discharge: 2024-06-28 | Disposition: A | Discharge status: Home / Self Care | Payer: PRIVATE HEALTH INSURANCE | Source: Ambulatory Visit | Attending: Nurse Practitioner | Admitting: Nurse Practitioner

## 2024-06-28 DIAGNOSIS — Z1231 Encounter for screening mammogram for malignant neoplasm of breast: Secondary | ICD-10-CM

## 2024-06-30 ENCOUNTER — Ambulatory Visit: Payer: PRIVATE HEALTH INSURANCE | Admitting: Nurse Practitioner

## 2024-07-18 ENCOUNTER — Ambulatory Visit: Payer: PRIVATE HEALTH INSURANCE | Admitting: Nurse Practitioner

## 2024-07-18 ENCOUNTER — Encounter: Payer: Self-pay | Admitting: Nurse Practitioner
